# Patient Record
Sex: Male | Born: 1955 | Race: White | Hispanic: No | Marital: Married | Smoking: Never smoker
Health system: Southern US, Community
[De-identification: ages and names within clinical notes are randomized; demographics above are authoritative.]

## PROBLEM LIST (undated history)

## (undated) DIAGNOSIS — R569 Unspecified convulsions: Secondary | ICD-10-CM

## (undated) DIAGNOSIS — N529 Male erectile dysfunction, unspecified: Secondary | ICD-10-CM

## (undated) DIAGNOSIS — M1990 Unspecified osteoarthritis, unspecified site: Secondary | ICD-10-CM

## (undated) DIAGNOSIS — R42 Dizziness and giddiness: Secondary | ICD-10-CM

## (undated) DIAGNOSIS — K219 Gastro-esophageal reflux disease without esophagitis: Secondary | ICD-10-CM

## (undated) DIAGNOSIS — G5603 Carpal tunnel syndrome, bilateral upper limbs: Secondary | ICD-10-CM

## (undated) HISTORY — PX: WISDOM TOOTH EXTRACTION: SHX21

## (undated) HISTORY — PX: COLONOSCOPY: SHX174

## (undated) HISTORY — PX: POLYPECTOMY: SHX149

## (undated) HISTORY — DX: Carpal tunnel syndrome, bilateral upper limbs: G56.03

## (undated) HISTORY — DX: Gastro-esophageal reflux disease without esophagitis: K21.9

## (undated) HISTORY — DX: Unspecified osteoarthritis, unspecified site: M19.90

## (undated) HISTORY — PX: TONSILLECTOMY: SUR1361

## (undated) HISTORY — DX: Male erectile dysfunction, unspecified: N52.9

## (undated) HISTORY — DX: Dizziness and giddiness: R42

---

## 2002-03-08 ENCOUNTER — Encounter: Payer: Self-pay | Admitting: Internal Medicine

## 2002-03-08 ENCOUNTER — Ambulatory Visit (HOSPITAL_COMMUNITY): Admission: RE | Admit: 2002-03-08 | Discharge: 2002-03-08 | Payer: Self-pay | Admitting: Internal Medicine

## 2003-04-02 ENCOUNTER — Ambulatory Visit (HOSPITAL_COMMUNITY): Admission: RE | Admit: 2003-04-02 | Discharge: 2003-04-02 | Payer: Self-pay | Admitting: Internal Medicine

## 2003-04-02 ENCOUNTER — Encounter: Payer: Self-pay | Admitting: Internal Medicine

## 2004-03-14 ENCOUNTER — Ambulatory Visit (HOSPITAL_COMMUNITY): Admission: RE | Admit: 2004-03-14 | Discharge: 2004-03-14 | Payer: Self-pay | Admitting: Internal Medicine

## 2004-10-22 ENCOUNTER — Ambulatory Visit (HOSPITAL_COMMUNITY): Admission: RE | Admit: 2004-10-22 | Discharge: 2004-10-22 | Payer: Self-pay | Admitting: Orthopedic Surgery

## 2005-08-13 ENCOUNTER — Ambulatory Visit: Payer: Self-pay

## 2005-08-13 ENCOUNTER — Ambulatory Visit: Payer: Self-pay | Admitting: Internal Medicine

## 2005-08-21 ENCOUNTER — Ambulatory Visit: Payer: Self-pay

## 2011-12-17 ENCOUNTER — Other Ambulatory Visit (HOSPITAL_COMMUNITY): Payer: Self-pay | Admitting: Internal Medicine

## 2011-12-17 ENCOUNTER — Ambulatory Visit (HOSPITAL_COMMUNITY)
Admission: RE | Admit: 2011-12-17 | Discharge: 2011-12-17 | Disposition: A | Discharge status: Home / Self Care | Payer: Managed Care, Other (non HMO) | Source: Ambulatory Visit | Attending: Internal Medicine | Admitting: Internal Medicine

## 2011-12-17 DIAGNOSIS — Z0000 Encounter for general adult medical examination without abnormal findings: Secondary | ICD-10-CM

## 2011-12-17 DIAGNOSIS — Z77090 Contact with and (suspected) exposure to asbestos: Secondary | ICD-10-CM | POA: Insufficient documentation

## 2011-12-17 DIAGNOSIS — M25519 Pain in unspecified shoulder: Secondary | ICD-10-CM | POA: Insufficient documentation

## 2011-12-21 ENCOUNTER — Encounter: Payer: Self-pay | Admitting: Gastroenterology

## 2011-12-30 ENCOUNTER — Ambulatory Visit (AMBULATORY_SURGERY_CENTER): Payer: Managed Care, Other (non HMO) | Admitting: *Deleted

## 2011-12-30 ENCOUNTER — Encounter: Payer: Self-pay | Admitting: Gastroenterology

## 2011-12-30 VITALS — Ht 69.0 in | Wt 215.3 lb

## 2011-12-30 DIAGNOSIS — Z1211 Encounter for screening for malignant neoplasm of colon: Secondary | ICD-10-CM

## 2011-12-30 MED ORDER — MOVIPREP 100 G PO SOLR: ORAL | Status: DC

## 2012-01-11 ENCOUNTER — Encounter: Payer: Self-pay | Admitting: Gastroenterology

## 2012-01-11 ENCOUNTER — Ambulatory Visit (AMBULATORY_SURGERY_CENTER): Payer: Managed Care, Other (non HMO) | Admitting: Gastroenterology

## 2012-01-11 VITALS — BP 127/92 | HR 82 | Temp 97.6°F | Resp 20 | Ht 69.0 in | Wt 215.0 lb

## 2012-01-11 DIAGNOSIS — D126 Benign neoplasm of colon, unspecified: Secondary | ICD-10-CM

## 2012-01-11 DIAGNOSIS — Z1211 Encounter for screening for malignant neoplasm of colon: Secondary | ICD-10-CM

## 2012-01-11 MED ORDER — SODIUM CHLORIDE 0.9 % IV SOLN: 500.0000 mL | INTRAVENOUS | Status: DC

## 2012-01-11 NOTE — Patient Instructions (Signed)
You had a colon polyp.   Your results will be mailed to your home within 2 weeks.   Resume your routine medications today.   If you have any questions, call us at [phone]. Thank-you.

## 2012-01-11 NOTE — Op Note (Signed)
[facility] [address] [place], [place]  [address]  COLONOSCOPY PROCEDURE REPORT  PATIENT:  Barry, Miles  MR#:  [id number] BIRTHDATE:  1956-05-18, 55 yrs. old  GENDER:  male ENDOSCOPIST:  Judie Petit T. Russella Dar, MD, [facility] Referred by:  Lucky Cowboy, M.D. PROCEDURE DATE:  01/11/2012 PROCEDURE:  Colonoscopy with biopsy ASA CLASS:  Class II INDICATIONS:  1) Routine Risk Screening MEDICATIONS:   These medications were titrated to patient response per physician's verbal order 100 mcg IV, Versed 10 mg IV DESCRIPTION OF PROCEDURE:   After the risks benefits and alternatives of the procedure were thoroughly explained, informed consent was obtained.  Digital rectal exam was performed and revealed no abnormalities.   The LB 180AL [id number] endoscope was introduced through the anus and advanced to the cecum, which was identified by both the appendix and ileocecal valve, without limitations.  The quality of the prep was excellent, using MoviPrep.  The instrument was then slowly withdrawn as the colon was fully examined. <<PROCEDUREIMAGES>> FINDINGS:  A sessile polyp was found in the mid transverse colon. It was 4 mm in size. The polyp was removed using cold biopsy forceps.  Otherwise normal colonoscopy without other polyps, masses, vascular ectasias, or inflammatory changes.   Retroflexed views in the rectum revealed no abnormalities.  The time to cecum = 2  minutes. The scope was then withdrawn (time =  10.67 min) from the patient and the procedure completed.  COMPLICATIONS:  None  ENDOSCOPIC IMPRESSION: 1) 4 mm sessile polyp in the mid transverse colon  RECOMMENDATIONS: 1) Await pathology results 2) If the polyp is adenomatous (pre-cancerous), repeat colonoscopy in 5 years. Otherwise follow colorectal cancer screening guidelines for "routine risk" patients with colonoscopy in 10 years.  Venita Lick. Russella Dar, MD, [facility]  n. eSIGNED:   Venita Lick. Derec Mozingo at 01/11/2012 02:26  PM  Lauretta Grill, [id number]

## 2012-01-11 NOTE — Progress Notes (Signed)
Patient did not have preoperative order for IV antibiotic SSI prophylaxis. (G8918)  Patient did not experience any of the following events: a burn prior to discharge; a fall within the facility; wrong site/side/patient/procedure/implant event; or a hospital transfer or hospital admission upon discharge from the facility. (G8907)  

## 2012-01-12 ENCOUNTER — Telehealth: Payer: Self-pay | Admitting: *Deleted

## 2012-01-12 NOTE — Telephone Encounter (Signed)
  Follow up Call-  Call back number 01/11/2012  Post procedure Call Back phone  # [phone] hm  Permission to leave phone message Yes     Patient questions:  Do you have a fever, pain , or abdominal swelling? no Pain Score  0 *  Have you tolerated food without any problems? yes  Have you been able to return to your normal activities? yes  Do you have any questions about your discharge instructions: Diet   no Medications  no Follow up visit  no  Do you have questions or concerns about your Care? no  Actions: * If pain score is 4 or above: No action needed, pain <4.

## 2012-01-15 ENCOUNTER — Encounter: Payer: Self-pay | Admitting: Gastroenterology

## 2012-02-20 ENCOUNTER — Ambulatory Visit (INDEPENDENT_AMBULATORY_CARE_PROVIDER_SITE_OTHER): Payer: Managed Care, Other (non HMO) | Admitting: Family Medicine

## 2012-02-20 VITALS — BP 144/84 | HR 83 | Temp 98.4°F | Resp 16 | Ht 69.0 in | Wt 213.0 lb

## 2012-02-20 DIAGNOSIS — J339 Nasal polyp, unspecified: Secondary | ICD-10-CM

## 2012-02-20 DIAGNOSIS — R42 Dizziness and giddiness: Secondary | ICD-10-CM

## 2012-02-20 DIAGNOSIS — J302 Other seasonal allergic rhinitis: Secondary | ICD-10-CM

## 2012-02-20 DIAGNOSIS — M549 Dorsalgia, unspecified: Secondary | ICD-10-CM

## 2012-02-20 DIAGNOSIS — R109 Unspecified abdominal pain: Secondary | ICD-10-CM

## 2012-02-20 DIAGNOSIS — H6980 Other specified disorders of Eustachian tube, unspecified ear: Secondary | ICD-10-CM

## 2012-02-20 LAB — POCT URINALYSIS DIPSTICK
Blood, UA: NEGATIVE
Glucose, UA: NEGATIVE
Leukocytes, UA: NEGATIVE
Nitrite, UA: NEGATIVE
Urobilinogen, UA: 0.2

## 2012-02-20 LAB — POCT UA - MICROSCOPIC ONLY
Casts, Ur, LPF, POC: NEGATIVE
Mucus, UA: POSITIVE
Yeast, UA: NEGATIVE

## 2012-02-20 MED ORDER — CYCLOBENZAPRINE HCL 10 MG PO TABS: 10.0000 mg | ORAL_TABLET | Freq: Every evening | ORAL | Status: AC | PRN

## 2012-02-20 MED ORDER — ACETAMINOPHEN-CODEINE #3 300-30 MG PO TABS: 1.0000 | ORAL_TABLET | Freq: Four times a day (QID) | ORAL | Status: AC | PRN

## 2012-02-20 MED ORDER — MOMETASONE FUROATE 50 MCG/ACT NA SUSP: 2.0000 | Freq: Every day | NASAL | Status: AC

## 2012-02-20 MED ORDER — MECLIZINE HCL 25 MG PO TABS: 25.0000 mg | ORAL_TABLET | Freq: Three times a day (TID) | ORAL | Status: AC | PRN

## 2012-02-20 MED ORDER — MELOXICAM 7.5 MG PO TABS: 7.5000 mg | ORAL_TABLET | Freq: Two times a day (BID) | ORAL | Status: AC

## 2012-02-20 NOTE — Progress Notes (Signed)
  Subjective:    Patient ID: Barry Miles, male    DOB: [birth year], 56 y.o.   MRN: [id number]  Back Pain This is a recurrent problem. The current episode started in the past 7 days. The problem occurs constantly. The problem has been gradually worsening since onset. The quality of the pain is described as cramping. The pain does not radiate. The pain is at a severity of 6/10. The pain is moderate. The pain is the same all the time. The symptoms are aggravated by twisting.   Worse with cough and sneeze; sick last week with URI symptoms  Complains of (R) sided muscle spam Denies radiation to lower extremities Denies weakness to the lower extremities  SH/ Welder  Review of Systems  Musculoskeletal: Positive for back pain.       Objective:   Physical Exam  Constitutional: He appears well-developed and well-nourished.  HENT:  Right Ear: Tympanic membrane is retracted.  Left Ear: Tympanic membrane is retracted.  Nose: Mucosal edema present.  Neck: Neck supple.  Cardiovascular: Normal rate, regular rhythm and normal heart sounds.   Pulmonary/Chest: Effort normal and breath sounds normal.  Musculoskeletal:       Lumbar back: Pain: negative SLR.  Neurological: He is alert. He has normal strength. He displays normal reflexes. No sensory deficit. He exhibits normal muscle tone.  Reflex Scores:      Patellar reflexes are 1+ on the right side and 1+ on the left side. Skin: Skin is warm.  Psychiatric: He has a normal mood and affect.     Results for orders placed in visit on 02/20/12  POCT UA - MICROSCOPIC ONLY      Component Value Range   WBC, Ur, HPF, POC 1-3     RBC, urine, microscopic 1-3     Bacteria, U Microscopic 1+     Mucus, UA pos     Epithelial cells, urine per micros neg     Crystals, Ur, HPF, POC neg     Casts, Ur, LPF, POC neg     Yeast, UA neg    POCT URINALYSIS DIPSTICK      Component Value Range   Color, UA yellow     Clarity, UA clear     Glucose, UA neg      Bilirubin, UA neg     Ketones, UA neg     Spec Grav, UA 1.015     Blood, UA neg     pH, UA 6.5     Protein, UA neg     Urobilinogen, UA 0.2     Nitrite, UA neg     Leukocytes, UA Negative          Assessment & Plan:   1. Flank pain  POCT UA - Microscopic Only, POCT urinalysis dipstick  2. Back pain, acute  cyclobenzaprine (FLEXERIL) 10 MG tablet, meloxicam (MOBIC) 7.5 MG tablet, acetaminophen-codeine (TYLENOL #3) 300-30 MG per tablet  3. Vertigo  mometasone (NASONEX) 50 MCG/ACT nasal spray  4. ETD (eustachian tube dysfunction)  mometasone (NASONEX) 50 MCG/ACT nasal spray  5. Seasonal allergies     Anticipatory guidance

## 2013-03-07 ENCOUNTER — Encounter: Payer: Managed Care, Other (non HMO) | Admitting: Cardiology

## 2014-01-26 ENCOUNTER — Encounter: Payer: Self-pay | Admitting: *Deleted

## 2014-01-26 DIAGNOSIS — G5603 Carpal tunnel syndrome, bilateral upper limbs: Secondary | ICD-10-CM | POA: Insufficient documentation

## 2014-02-01 ENCOUNTER — Encounter: Payer: Self-pay | Admitting: Emergency Medicine

## 2014-03-02 ENCOUNTER — Ambulatory Visit (INDEPENDENT_AMBULATORY_CARE_PROVIDER_SITE_OTHER): Payer: Managed Care, Other (non HMO) | Admitting: Emergency Medicine

## 2014-03-02 VITALS — BP 118/84 | HR 76 | Temp 98.1°F | Resp 16 | Ht 68.0 in | Wt 215.2 lb

## 2014-03-02 DIAGNOSIS — E559 Vitamin D deficiency, unspecified: Secondary | ICD-10-CM

## 2014-03-02 DIAGNOSIS — Z1212 Encounter for screening for malignant neoplasm of rectum: Secondary | ICD-10-CM

## 2014-03-02 DIAGNOSIS — K219 Gastro-esophageal reflux disease without esophagitis: Secondary | ICD-10-CM

## 2014-03-02 DIAGNOSIS — Z79899 Other long term (current) drug therapy: Secondary | ICD-10-CM

## 2014-03-02 DIAGNOSIS — Z111 Encounter for screening for respiratory tuberculosis: Secondary | ICD-10-CM

## 2014-03-02 DIAGNOSIS — N529 Male erectile dysfunction, unspecified: Secondary | ICD-10-CM

## 2014-03-02 DIAGNOSIS — Z0000 Encounter for general adult medical examination without abnormal findings: Secondary | ICD-10-CM

## 2014-03-02 LAB — CBC WITH DIFFERENTIAL/PLATELET
BASOS ABS: 0 10*3/uL (ref 0.0–0.1)
BASOS PCT: 0 % (ref 0–1)
EOS ABS: 0.1 10*3/uL (ref 0.0–0.7)
Eosinophils Relative: 2 % (ref 0–5)
HCT: 45.8 % (ref 39.0–52.0)
HEMOGLOBIN: 16 g/dL (ref 13.0–17.0)
Lymphocytes Relative: 33 % (ref 12–46)
Lymphs Abs: 1.9 10*3/uL (ref 0.7–4.0)
MCH: 31.5 pg (ref 26.0–34.0)
MCHC: 34.9 g/dL (ref 30.0–36.0)
MCV: 90.2 fL (ref 78.0–100.0)
Monocytes Absolute: 0.5 10*3/uL (ref 0.1–1.0)
Monocytes Relative: 8 % (ref 3–12)
NEUTROS ABS: 3.3 10*3/uL (ref 1.7–7.7)
NEUTROS PCT: 57 % (ref 43–77)
PLATELETS: 167 10*3/uL (ref 150–400)
RBC: 5.08 MIL/uL (ref 4.22–5.81)
RDW: 13.7 % (ref 11.5–15.5)
WBC: 5.8 10*3/uL (ref 4.0–10.5)

## 2014-03-02 LAB — HEMOGLOBIN A1C
HEMOGLOBIN A1C: 5.4 % (ref <5.7)
MEAN PLASMA GLUCOSE: 108 mg/dL (ref <117)

## 2014-03-02 NOTE — Patient Instructions (Addendum)
Erectile Dysfunction Erectile dysfunction is the inability to get or sustain a good enough erection to have sexual intercourse. Erectile dysfunction may involve:  Inability to get an erection.  Lack of enough hardness to allow penetration.  Loss of the erection before sex is finished.  Premature ejaculation. CAUSES  Certain drugs, such as:  Pain relievers.  Antihistamines.  Antidepressants.  Blood pressure medicines.  Water pills (diuretics).  Ulcer medicines.  Muscle relaxants.  Illegal drugs.  Excessive drinking.  Psychological causes, such as:  Anxiety.  Depression.  Sadness.  Exhaustion.  Performance fear.  Stress.  Physical causes, such as:  Artery problems. This may include diabetes, smoking, liver disease, or atherosclerosis.  High blood pressure.  Hormonal problems, such as low testosterone.  Obesity.  Nerve problems. This may include back or pelvic injuries, diabetes mellitus, multiple sclerosis, or Parkinson disease. SYMPTOMS  Inability to get an erection.  Lack of enough hardness to allow penetration.  Loss of the erection before sex is finished.  Premature ejaculation.  Normal erections at some times, but with frequent unsatisfactory episodes.  Orgasms that are not satisfactory in sensation or frequency.  Low sexual satisfaction in either partner because of erection problems.  A curved penis occurring with erection. The curve may cause pain or may be too curved to allow for intercourse.  Never having nighttime erections. DIAGNOSIS Your caregiver can often diagnose this condition by:  Performing a physical exam to find other diseases or specific problems with the penis.  Asking you detailed questions about the problem.  Performing blood tests to check for diabetes mellitus or to measure hormone levels.  Performing urine tests to find other underlying health conditions.  Performing an ultrasound exam to check for  scarring.  Performing a test to check blood flow to the penis.  Doing a sleep study at home to measure nighttime erections. TREATMENT   You may be prescribed medicines by mouth.  You may be given medicine injections into the penis.  You may be prescribed a vacuum pump with a ring.  Penile implant surgery may be performed. You may receive:  An inflatable implant.  A semirigid implant.  Blood vessel surgery may be performed. HOME CARE INSTRUCTIONS  If you are prescribed oral medicine, you should take the medicine as prescribed. Do not increase the dosage without first discussing it with your physician.  If you are using self-injections, be careful to avoid any veins that are on the surface of the penis. Apply pressure to the injection site for 5 minutes.  If you are using a vacuum pump, make sure you have read the instructions before using it. Discuss any questions with your physician before taking the pump home. SEEK MEDICAL CARE IF:  You experience pain that is not responsive to the pain medicine you have been prescribed.  You experience nausea or vomiting. SEEK IMMEDIATE MEDICAL CARE IF:   When taking oral or injectable medications, you experience an erection that lasts longer than 4 hours. If your physician is unavailable, go to the nearest emergency room for evaluation. An erection that lasts much longer than 4 hours can result in permanent damage to your penis.  You have pain that is severe.  You develop redness, severe pain, or severe swelling of your penis.  You have redness spreading up into your groin or lower abdomen.  You are unable to pass your urine. Document Released: 11/20/2000 Document Revised: 07/26/2013 Document Reviewed: 04/27/2013 [facility] Patient Information 2014 [place]. Diet for Gastroesophageal Reflux  Disease, Adult Reflux is when stomach acid flows up into the esophagus. The esophagus becomes irritated and sore (inflammation). When reflux  happens often and is severe, it is called gastroesophageal reflux disease (GERD). What you eat can help ease any discomfort caused by GERD. FOODS OR DRINKS TO AVOID OR LIMIT Coffee and black tea, with or without caffeine. Bubbly (carbonated) drinks with caffeine or energy drinks. Strong spices, such as pepper, cayenne pepper, curry, or chili powder. Peppermint or spearmint. Chocolate. High-fat foods, such as meats, fried food, oils, butter, or nuts. Fruits and vegetables that cause discomfort. This includes citrus fruits and tomatoes. Alcohol. If a certain food or drink irritates your GERD, avoid eating or drinking it. THINGS THAT MAY HELP GERD INCLUDE: Eat meals slowly. Eat 5 to 6 small meals a day, not 3 large meals. Do not eat food for a certain amount of time if it causes discomfort. Wait 3 hours after eating before lying down. Keep the head of your bed raised 6 to 9 inches (15 23 centimeters). Put a foam wedge or blocks under the legs of the bed. Stay active. Weight loss, if needed, may help ease your discomfort. Wear loose-fitting clothing. Do not smoke or chew tobacco. Document Released: 05/24/2012 Document Reviewed: 05/24/2012 [facility] Patient Information 2014 [place].

## 2014-03-03 LAB — MICROALBUMIN / CREATININE URINE RATIO
CREATININE, URINE: 131.5 mg/dL
MICROALB/CREAT RATIO: 3.8 mg/g (ref 0.0–30.0)
Microalb, Ur: 0.5 mg/dL (ref 0.00–1.89)

## 2014-03-03 LAB — TSH: TSH: 1.661 u[IU]/mL (ref 0.350–4.500)

## 2014-03-03 LAB — INSULIN, FASTING: INSULIN FASTING, SERUM: 7 u[IU]/mL (ref 3–28)

## 2014-03-03 LAB — VITAMIN D 25 HYDROXY (VIT D DEFICIENCY, FRACTURES): VIT D 25 HYDROXY: 86 ng/mL (ref 30–89)

## 2014-03-03 LAB — URINALYSIS, ROUTINE W REFLEX MICROSCOPIC
Bilirubin Urine: NEGATIVE
GLUCOSE, UA: NEGATIVE mg/dL
HGB URINE DIPSTICK: NEGATIVE
KETONES UR: NEGATIVE mg/dL
Leukocytes, UA: NEGATIVE
NITRITE: NEGATIVE
PH: 6.5 (ref 5.0–8.0)
Protein, ur: NEGATIVE mg/dL
SPECIFIC GRAVITY, URINE: 1.017 (ref 1.005–1.030)
Urobilinogen, UA: 0.2 mg/dL (ref 0.0–1.0)

## 2014-03-03 LAB — LIPID PANEL
CHOL/HDL RATIO: 3.4 ratio
Cholesterol: 155 mg/dL (ref 0–200)
HDL: 46 mg/dL (ref >39)
LDL CALC: 81 mg/dL (ref 0–99)
Triglycerides: 140 mg/dL (ref <150)
VLDL: 28 mg/dL (ref 0–40)

## 2014-03-03 LAB — PSA: PSA: 0.93 ng/mL (ref <=4.00)

## 2014-03-03 LAB — BASIC METABOLIC PANEL WITH GFR
BUN: 14 mg/dL (ref 6–23)
CALCIUM: 9.1 mg/dL (ref 8.4–10.5)
CO2: 23 mEq/L (ref 19–32)
Chloride: 104 mEq/L (ref 96–112)
Creat: 1.07 mg/dL (ref 0.50–1.35)
GFR, EST NON AFRICAN AMERICAN: 78 mL/min
GLUCOSE: 77 mg/dL (ref 70–99)
POTASSIUM: 3.8 meq/L (ref 3.5–5.3)
SODIUM: 140 meq/L (ref 135–145)

## 2014-03-03 LAB — HEPATIC FUNCTION PANEL
ALBUMIN: 4.3 g/dL (ref 3.5–5.2)
ALK PHOS: 64 U/L (ref 39–117)
ALT: 22 U/L (ref 0–53)
AST: 21 U/L (ref 0–37)
BILIRUBIN INDIRECT: 0.6 mg/dL (ref 0.2–1.2)
BILIRUBIN TOTAL: 0.7 mg/dL (ref 0.2–1.2)
Bilirubin, Direct: 0.1 mg/dL (ref 0.0–0.3)
Total Protein: 6.8 g/dL (ref 6.0–8.3)

## 2014-03-03 LAB — TESTOSTERONE: Testosterone: 375 ng/dL (ref 300–890)

## 2014-03-03 LAB — MAGNESIUM: Magnesium: 1.9 mg/dL (ref 1.5–2.5)

## 2014-03-05 LAB — TB SKIN TEST
INDURATION: 0 mm
TB Skin Test: NEGATIVE

## 2014-03-06 ENCOUNTER — Encounter: Payer: Self-pay | Admitting: Emergency Medicine

## 2014-03-06 DIAGNOSIS — K219 Gastro-esophageal reflux disease without esophagitis: Secondary | ICD-10-CM | POA: Insufficient documentation

## 2014-03-06 NOTE — Progress Notes (Signed)
Subjective:    Patient ID: Barry Miles, male    DOB: August 24, 1956, 58 y.o.   MRN: [id number]  HPI Comments: 58 yo pleasant WM CPE with concerns about reflux and ED. He notes mild increase in reflux with certain foods. He notes improves when limits those foods. He denies any bad breath or pain. He has not tried any OTC remedies routinely.  He is also concerned with difficulty getting and maintaining erection. He notes he has not spoken to his wife about this and it is causing marital issues.   She is exercising routinely with active job and overtime and eating healthy.      Current Outpatient Prescriptions on File Prior to Visit  Medication Sig Dispense Refill  . Cholecalciferol (VITAMIN D3) 2000 UNITS TABS Take 3 capsules by mouth 3 (three) times daily.      Marland Kitchen ibuprofen (ADVIL,MOTRIN) 200 MG tablet Take 200 mg by mouth every 6 (six) hours as needed.      . Multiple Vitamins-Minerals (MULTIVITAMIN WITH MINERALS) tablet Take 1 tablet by mouth daily.      . vitamin C (ASCORBIC ACID) 500 MG tablet Take 500 mg by mouth daily.       No current facility-administered medications on file prior to visit.   No Known Allergies Past Medical History  Diagnosis Date  . Arthritis   . Arthritis   . GERD (gastroesophageal reflux disease)   . Carpal tunnel syndrome on both sides    Past Surgical History  Procedure Laterality Date  . Tonsillectomy     History  Substance Use Topics  . Smoking status: Never Smoker   . Smokeless tobacco: Never Used  . Alcohol Use: No   Family History  Problem Relation Age of Onset  . Diabetes Mother   . Breast cancer Mother   . Diabetes Father   . Heart disease Father   . Colon polyps Maternal Uncle   . Diabetes Maternal Grandmother   . Diabetes Paternal Grandmother   . Colon polyps Maternal Aunt   . Colon cancer Neg Hx   . Esophageal cancer Neg Hx   . Stomach cancer Neg Hx     Patient Care Team: Unk Pinto, MD as PCP - General (Internal  Medicine) Eulis Manly. Gershon Crane, MD as Consulting Physician (Ophthalmology) Fay Records, MD as Consulting Physician (Cardiology) Ladene Artist, MD as Consulting Physician (Gastroenterology) Franchot Gallo, MD as Consulting Physician (Urology) Roseanne Kaufman, MD as Consulting Physician (Orthopedic Surgery)  MAINTENANCE: Colonoscopy: 01/11/12 repeat 3-5 years EYE: 2014 wnl Dentist: q 6 month  IMMUNIZATIONS: Tdap:2006 Pneumovax:n/a Zostavax:n/a Influenza:2014 at work   Review of Systems  Gastrointestinal:       Reflux  Genitourinary:       ED  All other systems reviewed and are negative.   BP 118/84  Pulse 76  Temp(Src) 98.1 F (36.7 C)  Resp 16  Ht 5\' 8"  (1.727 m)  Wt 215 lb 3.2 oz (97.614 kg)  BMI 32.73 kg/m2     Objective:   Physical Exam  Nursing note and vitals reviewed. Constitutional: He is oriented to person, place, and time. He appears well-developed and well-nourished.  HENT:  Head: Normocephalic and atraumatic.  Right Ear: External ear normal.  Left Ear: External ear normal.  Nose: Nose normal.  Mouth/Throat: Oropharynx is clear and moist. No oropharyngeal exudate.  Eyes: Conjunctivae and EOM are normal. Pupils are equal, round, and reactive to light. Right eye exhibits no discharge. Left eye exhibits no discharge.  No scleral icterus.  Neck: Normal range of motion. Neck supple. No JVD present. No tracheal deviation present. No thyromegaly present.  Cardiovascular: Normal rate, regular rhythm, normal heart sounds and intact distal pulses.   Pulmonary/Chest: Effort normal and breath sounds normal.  Abdominal: Soft. Bowel sounds are normal. He exhibits no distension and no mass. There is no tenderness. There is no rebound and no guarding.  Genitourinary: Rectum normal. Guaiac negative stool. No penile tenderness.  1+ prostate soft, Hemorrhoid NT, non-thrombosed ext  Musculoskeletal: Normal range of motion. He exhibits no edema and no tenderness.   Lymphadenopathy:    He has no cervical adenopathy.  Neurological: He is alert and oriented to person, place, and time. He has normal reflexes. No cranial nerve deficit. He exhibits normal muscle tone. Coordination normal.  Skin: Skin is warm and dry. No rash noted. No erythema. No pallor.  Psychiatric: He has a normal mood and affect. His behavior is normal. Judgment and thought content normal.          Assessment & Plan:  1. CPE- Update screening labs/ History/ Immunizations/ Testing as needed. Advised healthy diet, QD exercise, increase H20 and continue RX/ Vitamins AD.  2. GERD- Diet/ hygiene discussed, May try OTC Nexium and call with results  3. Erectile dysfunction- Discussed possibility of mental block and need to discus with wife, SX given Stendra 100 mg, Cialis 20 mg both AD, do not take together advised try to take 1/2 of either and call with results for prescription. SE explained ER if occur.

## 2014-04-02 ENCOUNTER — Other Ambulatory Visit: Payer: Self-pay | Admitting: Emergency Medicine

## 2014-04-02 MED ORDER — SILDENAFIL CITRATE 100 MG PO TABS: 100.0000 mg | ORAL_TABLET | ORAL | Status: DC | PRN

## 2015-02-04 ENCOUNTER — Encounter: Payer: Self-pay | Admitting: Emergency Medicine

## 2015-03-07 ENCOUNTER — Encounter: Payer: Self-pay | Admitting: Emergency Medicine

## 2015-03-07 ENCOUNTER — Ambulatory Visit (INDEPENDENT_AMBULATORY_CARE_PROVIDER_SITE_OTHER): Payer: Managed Care, Other (non HMO) | Admitting: Emergency Medicine

## 2015-03-07 VITALS — BP 142/86 | HR 64 | Temp 98.2°F | Resp 18 | Ht 68.0 in | Wt 218.0 lb

## 2015-03-07 DIAGNOSIS — Z1212 Encounter for screening for malignant neoplasm of rectum: Secondary | ICD-10-CM

## 2015-03-07 DIAGNOSIS — Z125 Encounter for screening for malignant neoplasm of prostate: Secondary | ICD-10-CM

## 2015-03-07 DIAGNOSIS — Z0000 Encounter for general adult medical examination without abnormal findings: Secondary | ICD-10-CM

## 2015-03-07 DIAGNOSIS — Z833 Family history of diabetes mellitus: Secondary | ICD-10-CM

## 2015-03-07 DIAGNOSIS — Z0001 Encounter for general adult medical examination with abnormal findings: Secondary | ICD-10-CM

## 2015-03-07 DIAGNOSIS — R6889 Other general symptoms and signs: Secondary | ICD-10-CM

## 2015-03-07 DIAGNOSIS — R5381 Other malaise: Secondary | ICD-10-CM

## 2015-03-07 DIAGNOSIS — Z789 Other specified health status: Secondary | ICD-10-CM

## 2015-03-07 DIAGNOSIS — R030 Elevated blood-pressure reading, without diagnosis of hypertension: Secondary | ICD-10-CM

## 2015-03-07 DIAGNOSIS — Z79899 Other long term (current) drug therapy: Secondary | ICD-10-CM

## 2015-03-07 DIAGNOSIS — E559 Vitamin D deficiency, unspecified: Secondary | ICD-10-CM

## 2015-03-07 DIAGNOSIS — R5383 Other fatigue: Secondary | ICD-10-CM

## 2015-03-07 LAB — CBC WITH DIFFERENTIAL/PLATELET
BASOS PCT: 0 % (ref 0–1)
Basophils Absolute: 0 10*3/uL (ref 0.0–0.1)
EOS ABS: 0.1 10*3/uL (ref 0.0–0.7)
Eosinophils Relative: 2 % (ref 0–5)
HEMATOCRIT: 45.5 % (ref 39.0–52.0)
HEMOGLOBIN: 15.7 g/dL (ref 13.0–17.0)
LYMPHS ABS: 1.5 10*3/uL (ref 0.7–4.0)
LYMPHS PCT: 28 % (ref 12–46)
MCH: 31.3 pg (ref 26.0–34.0)
MCHC: 34.5 g/dL (ref 30.0–36.0)
MCV: 90.6 fL (ref 78.0–100.0)
MPV: 9.4 fL (ref 8.6–12.4)
Monocytes Absolute: 0.4 10*3/uL (ref 0.1–1.0)
Monocytes Relative: 7 % (ref 3–12)
NEUTROS ABS: 3.3 10*3/uL (ref 1.7–7.7)
NEUTROS PCT: 63 % (ref 43–77)
Platelets: 160 10*3/uL (ref 150–400)
RBC: 5.02 MIL/uL (ref 4.22–5.81)
RDW: 13.7 % (ref 11.5–15.5)
WBC: 5.3 10*3/uL (ref 4.0–10.5)

## 2015-03-07 LAB — HEMOGLOBIN A1C
HEMOGLOBIN A1C: 5.6 % (ref <5.7)
MEAN PLASMA GLUCOSE: 114 mg/dL (ref <117)

## 2015-03-07 MED ORDER — TADALAFIL 20 MG PO TABS: 20.0000 mg | ORAL_TABLET | Freq: Every day | ORAL | Status: DC | PRN

## 2015-03-07 NOTE — Patient Instructions (Signed)
Erectile Dysfunction Erectile dysfunction is the inability to get or sustain a good enough erection to have sexual intercourse. Erectile dysfunction may involve:  Inability to get an erection.  Lack of enough hardness to allow penetration.  Loss of the erection before sex is finished.  Premature ejaculation. CAUSES  Certain drugs, such as:  Pain relievers.  Antihistamines.  Antidepressants.  Blood pressure medicines.  Water pills (diuretics).  Ulcer medicines.  Muscle relaxants.  Illegal drugs.  Excessive drinking.  Psychological causes, such as:  Anxiety.  Depression.  Sadness.  Exhaustion.  Performance fear.  Stress.  Physical causes, such as:  Artery problems. This may include diabetes, smoking, liver disease, or atherosclerosis.  High blood pressure.  Hormonal problems, such as low testosterone.  Obesity.  Nerve problems. This may include back or pelvic injuries, diabetes mellitus, multiple sclerosis, or Parkinson disease. SYMPTOMS  Inability to get an erection.  Lack of enough hardness to allow penetration.  Loss of the erection before sex is finished.  Premature ejaculation.  Normal erections at some times, but with frequent unsatisfactory episodes.  Orgasms that are not satisfactory in sensation or frequency.  Low sexual satisfaction in either partner because of erection problems.  A curved penis occurring with erection. The curve may cause pain or may be too curved to allow for intercourse.  Never having nighttime erections. DIAGNOSIS Your caregiver can often diagnose this condition by:  Performing a physical exam to find other diseases or specific problems with the penis.  Asking you detailed questions about the problem.  Performing blood tests to check for diabetes mellitus or to measure hormone levels.  Performing urine tests to find other underlying health conditions.  Performing an ultrasound exam to check for  scarring.  Performing a test to check blood flow to the penis.  Doing a sleep study at home to measure nighttime erections. TREATMENT   You may be prescribed medicines by mouth.  You may be given medicine injections into the penis.  You may be prescribed a vacuum pump with a ring.  Penile implant surgery may be performed. You may receive:  An inflatable implant.  A semirigid implant.  Blood vessel surgery may be performed. HOME CARE INSTRUCTIONS  If you are prescribed oral medicine, you should take the medicine as prescribed. Do not increase the dosage without first discussing it with your physician.  If you are using self-injections, be careful to avoid any veins that are on the surface of the penis. Apply pressure to the injection site for 5 minutes.  If you are using a vacuum pump, make sure you have read the instructions before using it. Discuss any questions with your physician before taking the pump home. SEEK MEDICAL CARE IF:  You experience pain that is not responsive to the pain medicine you have been prescribed.  You experience nausea or vomiting. SEEK IMMEDIATE MEDICAL CARE IF:   When taking oral or injectable medications, you experience an erection that lasts longer than 4 hours. If your physician is unavailable, go to the nearest emergency room for evaluation. An erection that lasts much longer than 4 hours can result in permanent damage to your penis.  You have pain that is severe.  You develop redness, severe pain, or severe swelling of your penis.  You have redness spreading up into your groin or lower abdomen.  You are unable to pass your urine. Document Released: 11/20/2000 Document Revised: 07/26/2013 Document Reviewed: 04/27/2013 [facility] Patient Information 2015 [place], [place]. This information is not  intended to replace advice given to you by your health care provider. Make sure you discuss any questions you have with your health care provider.

## 2015-03-07 NOTE — Progress Notes (Signed)
Subjective:    Patient ID: Barry Miles, male    DOB: Nov 04, 1956, 59 y.o.   MRN: [id number]  HPI Comments: 59 YO WM CPE with + FHX Cardiac disease/ diabetes but negative personal history. He denies CV symptoms. He is not eating as healthy. He is exercising sporadically. He keeps active. He notes occasionally fatigued with long work hours but resolves. He is unsure but thinks he may snore when sleeping.  He denies any enlarged prostate symptoms/ urinary issues but notes difficulty getting/ maintaining erection. He notes viagra made Ears feel hot but worked. Cialis worked best without side effects. He would like prescription for medication.  Lab Results      Component                Value               Date                      WBC                      5.8                 03/02/2014                HGB                      16.0                03/02/2014                HCT                      45.8                03/02/2014                PLT                      167                 03/02/2014                GLUCOSE                  77                  03/02/2014                CHOL                     155                 03/02/2014                TRIG                     140                 03/02/2014                HDL                      46                  03/02/2014  LDLCALC                  81                  03/02/2014                ALT                      22                  03/02/2014                AST                      21                  03/02/2014                NA                       140                 03/02/2014                K                        3.8                 03/02/2014                CL                       104                 03/02/2014                CREATININE               1.07                03/02/2014                BUN                      14                  03/02/2014                CO2                      23                   03/02/2014                TSH                      1.661               03/02/2014                PSA                      0.93                03/02/2014  HGBA1C                   5.4                 03/02/2014                MICROALBUR               0.50                03/02/2014                Medication List       This list is accurate as of: 03/07/15 11:02 AM.  Always use your most recent med list.               ibuprofen 200 MG tablet  Commonly known as:  ADVIL,MOTRIN  Take 200 mg by mouth every 6 (six) hours as needed.     Magnesium 400 MG Caps  Take 400 mg by mouth daily.     multivitamin with minerals tablet  Take 1 tablet by mouth daily.     sildenafil 100 MG tablet  Commonly known as:  VIAGRA  Take 1 tablet (100 mg total) by mouth as needed for erectile dysfunction.     tadalafil 20 MG tablet  Commonly known as:  CIALIS  Take 20 mg by mouth daily as needed for erectile dysfunction.     vitamin C 500 MG tablet  Commonly known as:  ASCORBIC ACID  Take 500 mg by mouth daily.     Vitamin D3 2000 UNITS Tabs  Take 3 capsules by mouth 3 (three) times daily.       No Known Allergies   Past Medical History  Diagnosis Date  . Arthritis   . Arthritis   . GERD (gastroesophageal reflux disease)   . Carpal tunnel syndrome on both sides    Past Surgical History  Procedure Laterality Date  . Tonsillectomy      History  Substance Use Topics  . Smoking status: Never Smoker   . Smokeless tobacco: Never Used  . Alcohol Use: No   Family History  Problem Relation Age of Onset  . Diabetes Mother   . Breast cancer Mother   . Diabetes Father   . Heart disease Father   . Colon polyps Maternal Uncle   . Diabetes Maternal Grandmother   . Diabetes Paternal Grandmother   . Colon polyps Maternal Aunt   . Colon cancer Neg Hx   . Esophageal cancer Neg Hx   . Stomach cancer Neg Hx    MAINTENANCE: Colonoscopy:01/11/12 POLYP due 2018 EYE: Glasses 2015  WNL Dentist:Q 6 month Stress- 2006 EF 55%  IMMUNIZATIONS: Tdap:2014 Pneumovax:n/a Zostavax:n/a Influenza:2015 @ work  Patient Care Team: Unk Pinto, MD as PCP - General (Internal Medicine) Rutherford Guys, MD as Consulting Physician (Ophthalmology) Fay Records, MD as Consulting Physician (Cardiology) Ladene Artist, MD as Consulting Physician (Gastroenterology) Franchot Gallo, MD as Consulting Physician (Urology) Roseanne Kaufman, MD as Consulting Physician (Orthopedic Surgery) Philipp Ovens, (Dentist)  Review of Systems  Constitutional: Negative for fatigue.  Respiratory: Negative for shortness of breath.   Cardiovascular: Negative for chest pain.  Genitourinary: Negative for decreased urine volume and penile pain.  Psychiatric/Behavioral: The patient is not nervous/anxious.   All other systems reviewed and are negative.  BP 142/86 mmHg  Pulse 64  Temp(Src) 98.2 F (36.8 C) (Temporal)  Resp 18  Ht 5\' 8"  (1.727 m)  Wt  218 lb (98.884 kg)  BMI 33.15 kg/m2     Objective:   Physical Exam  Constitutional: He is oriented to person, place, and time. He appears well-developed and well-nourished.  HENT:  Head: Normocephalic.  Nose: Nose normal.  Mouth/Throat: Oropharynx is clear and moist.  Large neck and tongue with partially obstructed airway.  Eyes: Conjunctivae and EOM are normal. Pupils are equal, round, and reactive to light. No scleral icterus.  Neck: Normal range of motion. Neck supple. No JVD present. No tracheal deviation present. No thyromegaly present.  Cardiovascular: Normal rate, regular rhythm, normal heart sounds and intact distal pulses.   Pulmonary/Chest: Effort normal and breath sounds normal.  Abdominal: Soft. Bowel sounds are normal. He exhibits no distension and no mass. There is no tenderness. There is no rebound and no guarding.  Genitourinary: Guaiac negative stool.  Mildly enlarged prostate  Musculoskeletal: Normal range of motion. He exhibits no  edema or tenderness.  Lymphadenopathy:    He has no cervical adenopathy.  Neurological: He is alert and oriented to person, place, and time. He has normal reflexes. No cranial nerve deficit. He exhibits normal muscle tone. Coordination normal.  Skin: Skin is warm and dry. No rash noted. No erythema.  Psychiatric: He has a normal mood and affect. His behavior is normal. Judgment and thought content normal.  Nursing note and vitals reviewed.   AORTA SCAN WNL EKG NSCSPT Prolonged QT STABLE     Assessment & Plan:  1. CPE- Update screening labs/ History/ Immunizations/ Testing as needed. Advised healthy diet, QD exercise, increase H20 and continue RX/ Vitamins AD. Screening for + FHX  2. Erectile dysfunction- Advised can refer to Urology for further workup but declines.   3. Fatigue- check labs, increase activity and H2O. If symptoms continue may need sleep study evaluation, patient will call if no improvement  4. Elevated BP w/o DX of HTN- Check BP call if >130/80, increase cardio, decrease stress

## 2015-03-08 LAB — URINALYSIS, ROUTINE W REFLEX MICROSCOPIC
Bilirubin Urine: NEGATIVE
GLUCOSE, UA: NEGATIVE mg/dL
Hgb urine dipstick: NEGATIVE
KETONES UR: NEGATIVE mg/dL
Leukocytes, UA: NEGATIVE
Nitrite: NEGATIVE
PH: 5 (ref 5.0–8.0)
Protein, ur: NEGATIVE mg/dL
Urobilinogen, UA: 0.2 mg/dL (ref 0.0–1.0)

## 2015-03-08 LAB — HEPATIC FUNCTION PANEL
ALK PHOS: 64 U/L (ref 39–117)
ALT: 17 U/L (ref 0–53)
AST: 21 U/L (ref 0–37)
Albumin: 4.3 g/dL (ref 3.5–5.2)
BILIRUBIN INDIRECT: 0.6 mg/dL (ref 0.2–1.2)
Bilirubin, Direct: 0.1 mg/dL (ref 0.0–0.3)
TOTAL PROTEIN: 6.8 g/dL (ref 6.0–8.3)
Total Bilirubin: 0.7 mg/dL (ref 0.2–1.2)

## 2015-03-08 LAB — BASIC METABOLIC PANEL WITH GFR
BUN: 13 mg/dL (ref 6–23)
CHLORIDE: 108 meq/L (ref 96–112)
CO2: 23 meq/L (ref 19–32)
CREATININE: 1.11 mg/dL (ref 0.50–1.35)
Calcium: 8.9 mg/dL (ref 8.4–10.5)
GFR, Est African American: 85 mL/min
GFR, Est Non African American: 74 mL/min
Glucose, Bld: 88 mg/dL (ref 70–99)
Potassium: 3.9 mEq/L (ref 3.5–5.3)
Sodium: 141 mEq/L (ref 135–145)

## 2015-03-08 LAB — LIPID PANEL
Cholesterol: 151 mg/dL (ref 0–200)
HDL: 42 mg/dL (ref >=40)
LDL CALC: 79 mg/dL (ref 0–99)
TRIGLYCERIDES: 150 mg/dL — AB (ref <150)
Total CHOL/HDL Ratio: 3.6 Ratio
VLDL: 30 mg/dL (ref 0–40)

## 2015-03-08 LAB — MAGNESIUM: Magnesium: 1.9 mg/dL (ref 1.5–2.5)

## 2015-03-08 LAB — MICROALBUMIN / CREATININE URINE RATIO
Creatinine, Urine: 40.3 mg/dL
MICROALB/CREAT RATIO: 7.4 mg/g (ref 0.0–30.0)
Microalb, Ur: 0.3 mg/dL (ref <2.0)

## 2015-03-08 LAB — PSA: PSA: 0.79 ng/mL (ref <=4.00)

## 2015-03-08 LAB — INSULIN, FASTING: Insulin fasting, serum: 4 u[IU]/mL (ref 2.0–19.6)

## 2015-03-08 LAB — TSH: TSH: 1.425 u[IU]/mL (ref 0.350–4.500)

## 2015-03-08 LAB — VITAMIN D 25 HYDROXY (VIT D DEFICIENCY, FRACTURES): VIT D 25 HYDROXY: 67 ng/mL (ref 30–100)

## 2015-03-11 ENCOUNTER — Encounter: Payer: Self-pay | Admitting: Emergency Medicine

## 2015-03-11 DIAGNOSIS — N529 Male erectile dysfunction, unspecified: Secondary | ICD-10-CM | POA: Insufficient documentation

## 2016-03-09 ENCOUNTER — Ambulatory Visit (INDEPENDENT_AMBULATORY_CARE_PROVIDER_SITE_OTHER): Payer: Managed Care, Other (non HMO) | Admitting: Internal Medicine

## 2016-03-09 ENCOUNTER — Other Ambulatory Visit: Payer: Self-pay | Admitting: Internal Medicine

## 2016-03-09 ENCOUNTER — Encounter: Payer: Self-pay | Admitting: Internal Medicine

## 2016-03-09 VITALS — BP 124/82 | HR 70 | Temp 98.2°F | Resp 18 | Ht 68.0 in | Wt 215.0 lb

## 2016-03-09 DIAGNOSIS — Z0001 Encounter for general adult medical examination with abnormal findings: Secondary | ICD-10-CM

## 2016-03-09 DIAGNOSIS — Z79899 Other long term (current) drug therapy: Secondary | ICD-10-CM

## 2016-03-09 DIAGNOSIS — Z1212 Encounter for screening for malignant neoplasm of rectum: Secondary | ICD-10-CM

## 2016-03-09 DIAGNOSIS — E559 Vitamin D deficiency, unspecified: Secondary | ICD-10-CM | POA: Diagnosis not present

## 2016-03-09 DIAGNOSIS — Z0000 Encounter for general adult medical examination without abnormal findings: Secondary | ICD-10-CM

## 2016-03-09 DIAGNOSIS — Z1329 Encounter for screening for other suspected endocrine disorder: Secondary | ICD-10-CM

## 2016-03-09 DIAGNOSIS — Z13220 Encounter for screening for lipoid disorders: Secondary | ICD-10-CM

## 2016-03-09 DIAGNOSIS — Z1159 Encounter for screening for other viral diseases: Secondary | ICD-10-CM | POA: Diagnosis not present

## 2016-03-09 DIAGNOSIS — E349 Endocrine disorder, unspecified: Secondary | ICD-10-CM

## 2016-03-09 DIAGNOSIS — Z1389 Encounter for screening for other disorder: Secondary | ICD-10-CM

## 2016-03-09 DIAGNOSIS — Z125 Encounter for screening for malignant neoplasm of prostate: Secondary | ICD-10-CM

## 2016-03-09 DIAGNOSIS — Z131 Encounter for screening for diabetes mellitus: Secondary | ICD-10-CM

## 2016-03-09 DIAGNOSIS — Z130 Encounter for screening for diseases of the blood and blood-forming organs and certain disorders involving the immune mechanism: Secondary | ICD-10-CM

## 2016-03-09 DIAGNOSIS — Z136 Encounter for screening for cardiovascular disorders: Secondary | ICD-10-CM

## 2016-03-09 LAB — HEPATIC FUNCTION PANEL
ALK PHOS: 71 U/L (ref 40–115)
ALT: 25 U/L (ref 9–46)
AST: 22 U/L (ref 10–35)
Albumin: 4.3 g/dL (ref 3.6–5.1)
BILIRUBIN DIRECT: 0.1 mg/dL (ref <=0.2)
Indirect Bilirubin: 0.4 mg/dL (ref 0.2–1.2)
TOTAL PROTEIN: 6.5 g/dL (ref 6.1–8.1)
Total Bilirubin: 0.5 mg/dL (ref 0.2–1.2)

## 2016-03-09 LAB — LIPID PANEL
CHOL/HDL RATIO: 3.6 ratio (ref <=5.0)
Cholesterol: 146 mg/dL (ref 125–200)
HDL: 41 mg/dL (ref >=40)
LDL CALC: 77 mg/dL (ref <130)
TRIGLYCERIDES: 139 mg/dL (ref <150)
VLDL: 28 mg/dL (ref <30)

## 2016-03-09 LAB — IRON AND TIBC
%SAT: 19 % (ref 15–60)
IRON: 64 ug/dL (ref 50–180)
TIBC: 335 ug/dL (ref 250–425)
UIBC: 271 ug/dL (ref 125–400)

## 2016-03-09 LAB — CBC WITH DIFFERENTIAL/PLATELET
Basophils Absolute: 0 cells/uL (ref 0–200)
Basophils Relative: 0 %
EOS PCT: 3 %
Eosinophils Absolute: 165 cells/uL (ref 15–500)
HCT: 46.3 % (ref 38.5–50.0)
Hemoglobin: 15.9 g/dL (ref 13.2–17.1)
LYMPHS ABS: 1540 {cells}/uL (ref 850–3900)
Lymphocytes Relative: 28 %
MCH: 31.3 pg (ref 27.0–33.0)
MCHC: 34.3 g/dL (ref 32.0–36.0)
MCV: 91.1 fL (ref 80.0–100.0)
MONO ABS: 330 {cells}/uL (ref 200–950)
MPV: 9.3 fL (ref 7.5–12.5)
Monocytes Relative: 6 %
NEUTROS ABS: 3465 {cells}/uL (ref 1500–7800)
Neutrophils Relative %: 63 %
Platelets: 149 10*3/uL (ref 140–400)
RBC: 5.08 MIL/uL (ref 4.20–5.80)
RDW: 14 % (ref 11.0–15.0)
WBC: 5.5 10*3/uL (ref 3.8–10.8)

## 2016-03-09 LAB — HEMOGLOBIN A1C
HEMOGLOBIN A1C: 5.6 % (ref <5.7)
Mean Plasma Glucose: 114 mg/dL

## 2016-03-09 LAB — BASIC METABOLIC PANEL WITH GFR
BUN: 12 mg/dL (ref 7–25)
CALCIUM: 8.9 mg/dL (ref 8.6–10.3)
CO2: 24 mmol/L (ref 20–31)
Chloride: 107 mmol/L (ref 98–110)
Creat: 0.97 mg/dL (ref 0.70–1.33)
GFR, EST NON AFRICAN AMERICAN: 86 mL/min (ref >=60)
Glucose, Bld: 85 mg/dL (ref 65–99)
Potassium: 4 mmol/L (ref 3.5–5.3)
SODIUM: 141 mmol/L (ref 135–146)

## 2016-03-09 LAB — MAGNESIUM: Magnesium: 2.1 mg/dL (ref 1.5–2.5)

## 2016-03-09 LAB — TSH: TSH: 1.64 mIU/L (ref 0.40–4.50)

## 2016-03-09 LAB — VITAMIN B12: VITAMIN B 12: 735 pg/mL (ref 200–1100)

## 2016-03-09 NOTE — Progress Notes (Signed)
Complete Physical  Assessment and Plan:   1. Encounter for general adult medical examination with abnormal findings  - CBC with Differential/Platelet - BASIC METABOLIC PANEL WITH GFR - Hepatic function panel - Magnesium  2. Screening for diabetes mellitus  - Hemoglobin A1c - Insulin, random  3. Screening for hyperlipidemia  - Lipid panel  4. Screening for thyroid disorder  - TSH  5. Screening for hematuria or proteinuria  - Urinalysis, Routine w reflex microscopic (not at [facility]) - Microalbumin / creatinine urine ratio  6. Screening for deficiency anemia  - Iron and TIBC - Vitamin B12  7. Screening for cardiovascular condition  - EKG 12-Lead - [place], RETROPERITNL ABD,  LTD  8. Screening for rectal cancer  - POC Hemoccult Bld/Stl (3-Cd Home Screen); Future  9. Screening for prostate cancer  - PSA  10. Testosterone deficiency   11. Vitamin D deficiency  - VITAMIN D 25 Hydroxy (Vit-D Deficiency, Fractures)    Discussed med's effects and SE's. Screening labs and tests as requested with regular follow-up as recommended.  HPI Patient presents for a complete physical.   His blood pressure has been controlled at home, today their BP is BP: 124/82 mmHg He does not workout. He denies chest pain, shortness of breath, dizziness.   He is on cholesterol medication and denies myalgias. His cholesterol is at goal. The cholesterol last visit was:   Lab Results  Component Value Date   CHOL 151 03/07/2015   HDL 42 03/07/2015   LDLCALC 79 03/07/2015   TRIG 150* 03/07/2015   CHOLHDL 3.6 03/07/2015    He has been working on diet and exercise for prediabetes, he is not on bASA, he is not on ACE/ARB and denies foot ulcerations, hyperglycemia, hypoglycemia , increased appetite, nausea, paresthesia of the feet, polydipsia, polyuria, visual disturbances, vomiting and weight loss. Last A1C in the office was:  Lab Results  Component Value Date   HGBA1C 5.6 03/07/2015     Patient is on Vitamin D supplement.   Lab Results  Component Value Date   VD25OH 67 03/07/2015      Last PSA was: Lab Results  Component Value Date   PSA 0.79 03/07/2015  .  Denies BPH symptoms daytime frequency, double voiding, dysuria, hematuria, hesitancy, incontinence, intermittency, nocturia, sensation of incomplete bladder emptying, suprapubic pain, urgency or weak urinary stream.  He reports that he is not currently exercising daily.  He does do a physical job.  He would like to lose some weight.  HE admits that he has been complacent with his diet.  Current Medications:  Current Outpatient Prescriptions on File Prior to Visit  Medication Sig Dispense Refill  . Cholecalciferol (VITAMIN D3) 2000 UNITS TABS Take 3 capsules by mouth 3 (three) times daily.    Marland Kitchen ibuprofen (ADVIL,MOTRIN) 200 MG tablet Take 200 mg by mouth every 6 (six) hours as needed.    . Magnesium 400 MG CAPS Take 400 mg by mouth daily.    . Multiple Vitamins-Minerals (MULTIVITAMIN WITH MINERALS) tablet Take 1 tablet by mouth daily.    . tadalafil (CIALIS) 20 MG tablet Take 1 tablet (20 mg total) by mouth daily as needed for erectile dysfunction. 10 tablet 1  . vitamin C (ASCORBIC ACID) 500 MG tablet Take 500 mg by mouth daily.     No current facility-administered medications on file prior to visit.    Health Maintenance:  Immunization History  Administered Date(s) Administered  . Influenza-Unspecified 09/06/2013  . PPD Test 03/02/2014  .  Td 08/27/2005  . Tdap 02/01/2013    Tetanus: 2014 Flu vaccine: 09/07/15, at work Colonoscopy: 2013 Eye Exam:  Dr. Peter Garter , Glasses, goes yearly Dentist: Dr. Philipp Ovens, twice yearly  Patient Care Team: Unk Pinto, MD as PCP - General (Internal Medicine) Rutherford Guys, MD as Consulting Physician (Ophthalmology) Fay Records, MD as Consulting Physician (Cardiology) Ladene Artist, MD as Consulting Physician (Gastroenterology) Franchot Gallo, MD as  Consulting Physician (Urology) Roseanne Kaufman, MD as Consulting Physician (Orthopedic Surgery)  Allergies: No Known Allergies  Medical History:  Past Medical History  Diagnosis Date  . Arthritis   . Arthritis   . GERD (gastroesophageal reflux disease)   . Carpal tunnel syndrome on both sides   . Erectile dysfunction     Surgical History:  Past Surgical History  Procedure Laterality Date  . Tonsillectomy      Family History:  Family History  Problem Relation Age of Onset  . Diabetes Mother   . Breast cancer Mother   . Diabetes Father   . Heart disease Father   . Colon polyps Maternal Uncle   . Diabetes Maternal Grandmother   . Diabetes Paternal Grandmother   . Colon polyps Maternal Aunt   . Colon cancer Neg Hx   . Esophageal cancer Neg Hx   . Stomach cancer Neg Hx     Social History:   Social History  Substance Use Topics  . Smoking status: Never Smoker   . Smokeless tobacco: Never Used  . Alcohol Use: No    Review of Systems:  Review of Systems  Constitutional: Negative for fever, chills and malaise/fatigue.  HENT: Negative for congestion, ear pain and sore throat.   Eyes: Negative.   Respiratory: Negative for cough, shortness of breath and wheezing.   Cardiovascular: Negative for chest pain, palpitations and leg swelling.  Gastrointestinal: Positive for heartburn. Negative for abdominal pain, diarrhea, constipation, blood in stool and melena.  Genitourinary: Negative.   Musculoskeletal: Positive for joint pain.  Neurological: Negative for dizziness, sensory change, loss of consciousness and headaches.  Psychiatric/Behavioral: Negative for depression. The patient is not nervous/anxious and does not have insomnia.     Physical Exam: Estimated body mass index is 32.7 kg/(m^2) as calculated from the following:   Height as of this encounter: 5\' 8"  (1.727 m).   Weight as of this encounter: 215 lb (97.523 kg). BP 124/82 mmHg  Pulse 70  Temp(Src) 98.2 F  (36.8 C) (Temporal)  Resp 18  Ht 5\' 8"  (1.727 m)  Wt 215 lb (97.523 kg)  BMI 32.70 kg/m2  General Appearance: Well nourished, in no apparent distress.  Eyes: PERRLA, EOMs, conjunctiva no swelling or erythema ENT/Mouth: Ear canals clear bilaterally with no erythema, swelling, discharge.  TMs normal bilaterally with no erythema, bulging, or retractions.  Oropharynx clear and moist with no exudate, swelling, or erythema.  Dentition normal.   Neck: Supple, thyroid normal. No bruits, JVD, cervical adenopathy Respiratory: Respiratory effort normal, BS equal bilaterally without rales, rhonchi, wheezing or stridor.  Cardio: RRR without murmurs, rubs or gallops. Brisk peripheral pulses without edema.  Chest: symmetric, with normal excursions Abdomen: Soft, nontender, no guarding, rebound, hernias, masses, or organomegaly. Genitourinary: Patient preference to defer to Dr. Melford Aase or Dr. Payton Mccallum for prostate exam Musculoskeletal: Full ROM all peripheral extremities,5/5 strength, and normal gait.  Skin: Warm, dry without rashes, lesions, ecchymosis. 4-5 scattered sebborrheic keratoses located on the back Neuro: A&Ox3, Cranial nerves intact, reflexes equal bilaterally. Normal muscle tone, no cerebellar symptoms. Sensation  intact.  Psych: Normal affect, Insight and Judgment appropriate.   EKG: Sinus rhythm with no changes from prior ekg, but does have confirmed QT prolongation which is same from prior  AORTA SCAN: WNL  Over 40 minutes of exam, counseling, chart review and critical decision making was performed  Starlyn Skeans 10:17 AM [facility]

## 2016-03-09 NOTE — Patient Instructions (Addendum)
Please try tumeric with black pepper extract twice daily to see if this will help with your arthritis and joint pains.    Epley Maneuver Self-Care WHAT IS THE EPLEY MANEUVER? The Epley maneuver is an exercise you can do to relieve symptoms of benign paroxysmal positional vertigo (BPPV). This condition is often just referred to as vertigo. BPPV is caused by the movement of tiny crystals (canaliths) inside your inner ear. The accumulation and movement of canaliths in your inner ear causes a sudden spinning sensation (vertigo) when you move your head to certain positions. Vertigo usually lasts about 30 seconds. BPPV usually occurs in just one ear. If you get vertigo when you lie on your left side, you probably have BPPV in your left ear. Your health care provider can tell you which ear is involved.  BPPV may be caused by a head injury. Many people older than 50 get BPPV for unknown reasons. If you have been diagnosed with BPPV, your health care provider may teach you how to do this maneuver. BPPV is not life threatening (benign) and usually goes away in time.  WHEN SHOULD I PERFORM THE EPLEY MANEUVER? You can do this maneuver at home whenever you have symptoms of vertigo. You may do the Epley maneuver up to 3 times a day until your symptoms of vertigo go away. HOW SHOULD I DO THE EPLEY MANEUVER? 1. Sit on the edge of a bed or table with your back straight. Your legs should be extended or hanging over the edge of the bed or table.  2. Turn your head halfway toward the affected ear.  3. Lie backward quickly with your head turned until you are lying flat on your back. You may want to position a pillow under your shoulders.  4. Hold this position for 30 seconds. You may experience an attack of vertigo. This is normal. Hold this position until the vertigo stops. 5. Then turn your head to the opposite direction until your unaffected ear is facing the floor.  6. Hold this position for 30 seconds. You may  experience an attack of vertigo. This is normal. Hold this position until the vertigo stops. 7. Now turn your whole body to the same side as your head. Hold for another 30 seconds.  8. You can then sit back up. ARE THERE RISKS TO THIS MANEUVER? In some cases, you may have other symptoms (such as changes in your vision, weakness, or numbness). If you have these symptoms, stop doing the maneuver and call your health care provider. Even if doing these maneuvers relieves your vertigo, you may still have dizziness. Dizziness is the sensation of light-headedness but without the sensation of movement. Even though the Epley maneuver may relieve your vertigo, it is possible that your symptoms will return within 5 years. WHAT SHOULD I DO AFTER THIS MANEUVER? After doing the Epley maneuver, you can return to your normal activities. Ask your doctor if there is anything you should do at home to prevent vertigo. This may include:  Sleeping with two or more pillows to keep your head elevated.  Not sleeping on the side of your affected ear.  Getting up slowly from bed.  Avoiding sudden movements during the day.  Avoiding extreme head movement, like looking up or bending over.  Wearing a cervical collar to prevent sudden head movements. WHAT SHOULD I DO IF MY SYMPTOMS GET WORSE? Call your health care provider if your vertigo gets worse. Call your provider right way if you have  other symptoms, including:   Nausea.  Vomiting.  Headache.  Weakness.  Numbness.  Vision changes.   This information is not intended to replace advice given to you by your health care provider. Make sure you discuss any questions you have with your health care provider.   Document Released: 11/28/2013 Document Reviewed: 11/28/2013 Elsevier Interactive Patient Education [employer].

## 2016-03-10 LAB — URINALYSIS, ROUTINE W REFLEX MICROSCOPIC
Bilirubin Urine: NEGATIVE
GLUCOSE, UA: NEGATIVE
HGB URINE DIPSTICK: NEGATIVE
Ketones, ur: NEGATIVE
LEUKOCYTES UA: NEGATIVE
NITRITE: NEGATIVE
PROTEIN: NEGATIVE
Specific Gravity, Urine: 1.006 (ref 1.001–1.035)
pH: 7 (ref 5.0–8.0)

## 2016-03-10 LAB — PSA: PSA: 1.28 ng/mL (ref <=4.00)

## 2016-03-10 LAB — VITAMIN D 25 HYDROXY (VIT D DEFICIENCY, FRACTURES): Vit D, 25-Hydroxy: 70 ng/mL (ref 30–100)

## 2016-03-10 LAB — INSULIN, RANDOM: Insulin: 5.8 u[IU]/mL (ref 2.0–19.6)

## 2016-03-10 LAB — MICROALBUMIN / CREATININE URINE RATIO
CREATININE, URINE: 42 mg/dL (ref 20–370)
Microalb, Ur: <0.2 mg/dL

## 2016-03-10 LAB — HEPATITIS C ANTIBODY: HCV Ab: NEGATIVE

## 2016-12-17 ENCOUNTER — Encounter: Payer: Self-pay | Admitting: Gastroenterology

## 2017-03-10 ENCOUNTER — Encounter: Payer: Self-pay | Admitting: Internal Medicine

## 2017-03-18 ENCOUNTER — Ambulatory Visit (INDEPENDENT_AMBULATORY_CARE_PROVIDER_SITE_OTHER): Payer: Managed Care, Other (non HMO) | Admitting: Physician Assistant

## 2017-03-18 ENCOUNTER — Encounter: Payer: Self-pay | Admitting: Physician Assistant

## 2017-03-18 VITALS — BP 128/82 | HR 81 | Temp 97.9°F | Resp 14 | Ht 69.0 in | Wt 220.6 lb

## 2017-03-18 DIAGNOSIS — K2100 Gastro-esophageal reflux disease with esophagitis, without bleeding: Secondary | ICD-10-CM

## 2017-03-18 DIAGNOSIS — Z131 Encounter for screening for diabetes mellitus: Secondary | ICD-10-CM

## 2017-03-18 DIAGNOSIS — Z125 Encounter for screening for malignant neoplasm of prostate: Secondary | ICD-10-CM | POA: Diagnosis not present

## 2017-03-18 DIAGNOSIS — M545 Low back pain, unspecified: Secondary | ICD-10-CM

## 2017-03-18 DIAGNOSIS — Z0000 Encounter for general adult medical examination without abnormal findings: Secondary | ICD-10-CM

## 2017-03-18 DIAGNOSIS — Z79899 Other long term (current) drug therapy: Secondary | ICD-10-CM

## 2017-03-18 DIAGNOSIS — E669 Obesity, unspecified: Secondary | ICD-10-CM | POA: Insufficient documentation

## 2017-03-18 DIAGNOSIS — I10 Essential (primary) hypertension: Secondary | ICD-10-CM

## 2017-03-18 DIAGNOSIS — Z13220 Encounter for screening for lipoid disorders: Secondary | ICD-10-CM

## 2017-03-18 DIAGNOSIS — E6609 Other obesity due to excess calories: Secondary | ICD-10-CM

## 2017-03-18 DIAGNOSIS — Z136 Encounter for screening for cardiovascular disorders: Secondary | ICD-10-CM

## 2017-03-18 DIAGNOSIS — E349 Endocrine disorder, unspecified: Secondary | ICD-10-CM

## 2017-03-18 DIAGNOSIS — N529 Male erectile dysfunction, unspecified: Secondary | ICD-10-CM

## 2017-03-18 DIAGNOSIS — Z130 Encounter for screening for diseases of the blood and blood-forming organs and certain disorders involving the immune mechanism: Secondary | ICD-10-CM

## 2017-03-18 DIAGNOSIS — E559 Vitamin D deficiency, unspecified: Secondary | ICD-10-CM

## 2017-03-18 DIAGNOSIS — Z1329 Encounter for screening for other suspected endocrine disorder: Secondary | ICD-10-CM

## 2017-03-18 DIAGNOSIS — Z1389 Encounter for screening for other disorder: Secondary | ICD-10-CM

## 2017-03-18 DIAGNOSIS — Z6832 Body mass index (BMI) 32.0-32.9, adult: Secondary | ICD-10-CM

## 2017-03-18 LAB — CBC WITH DIFFERENTIAL/PLATELET
BASOS PCT: 0 %
Basophils Absolute: 0 cells/uL (ref 0–200)
EOS PCT: 3 %
Eosinophils Absolute: 150 cells/uL (ref 15–500)
HEMATOCRIT: 44.5 % (ref 38.5–50.0)
Hemoglobin: 15.2 g/dL (ref 13.2–17.1)
LYMPHS PCT: 30 %
Lymphs Abs: 1500 cells/uL (ref 850–3900)
MCH: 31.6 pg (ref 27.0–33.0)
MCHC: 34.2 g/dL (ref 32.0–36.0)
MCV: 92.5 fL (ref 80.0–100.0)
MONO ABS: 350 {cells}/uL (ref 200–950)
MPV: 9.2 fL (ref 7.5–12.5)
Monocytes Relative: 7 %
Neutro Abs: 3000 cells/uL (ref 1500–7800)
Neutrophils Relative %: 60 %
PLATELETS: 178 10*3/uL (ref 140–400)
RBC: 4.81 MIL/uL (ref 4.20–5.80)
RDW: 14 % (ref 11.0–15.0)
WBC: 5 10*3/uL (ref 3.8–10.8)

## 2017-03-18 LAB — HEPATIC FUNCTION PANEL
ALK PHOS: 65 U/L (ref 40–115)
ALT: 21 U/L (ref 9–46)
AST: 21 U/L (ref 10–35)
Albumin: 4.2 g/dL (ref 3.6–5.1)
BILIRUBIN DIRECT: 0.1 mg/dL (ref <=0.2)
BILIRUBIN INDIRECT: 0.6 mg/dL (ref 0.2–1.2)
BILIRUBIN TOTAL: 0.7 mg/dL (ref 0.2–1.2)
Total Protein: 6.7 g/dL (ref 6.1–8.1)

## 2017-03-18 LAB — BASIC METABOLIC PANEL WITH GFR
BUN: 14 mg/dL (ref 7–25)
CALCIUM: 9 mg/dL (ref 8.6–10.3)
CO2: 23 mmol/L (ref 20–31)
Chloride: 107 mmol/L (ref 98–110)
Creat: 1.19 mg/dL (ref 0.70–1.33)
GFR, EST AFRICAN AMERICAN: 77 mL/min (ref >=60)
GFR, EST NON AFRICAN AMERICAN: 67 mL/min (ref >=60)
GLUCOSE: 87 mg/dL (ref 65–99)
POTASSIUM: 3.9 mmol/L (ref 3.5–5.3)
SODIUM: 139 mmol/L (ref 135–146)

## 2017-03-18 LAB — LIPID PANEL
CHOL/HDL RATIO: 3.2 ratio (ref <5.0)
CHOLESTEROL: 147 mg/dL (ref <200)
HDL: 46 mg/dL (ref >40)
LDL CALC: 75 mg/dL (ref <100)
TRIGLYCERIDES: 131 mg/dL (ref <150)
VLDL: 26 mg/dL (ref <30)

## 2017-03-18 LAB — IRON AND TIBC
%SAT: 22 % (ref 15–60)
Iron: 81 ug/dL (ref 50–180)
TIBC: 370 ug/dL (ref 250–425)
UIBC: 289 ug/dL (ref 125–400)

## 2017-03-18 LAB — TSH: TSH: 1.49 mIU/L (ref 0.40–4.50)

## 2017-03-18 MED ORDER — MELOXICAM 15 MG PO TABS: ORAL_TABLET | ORAL | 1 refills | Status: DC

## 2017-03-18 NOTE — Patient Instructions (Signed)
Simple math prevails.    1st - exercise does not produce significant weight loss - at best one converts fat into muscle , "bulks up", loses inches, but usually stays "weight neutral"     2nd - think of your body weightas a check book: If you eat more calories than you burn up - you save money or gain weight .... Or if you spend more money than you put in the check book, ie burn up more calories than you eat, then you lose weight     3rd - if you walk or run 1 mile, you burn up 100 calories - you have to burn up 3,500 calories to lose 1 pound, ie you have to walk/run 35 miles to lose 1 measly pound. So if you want to lose 10 #, then you have to walk/run 350 miles, so.... clearly exercise is not the solution.     4. So if you consume 1,500 calories, then you have to burn up the equivalent of 15 miles to stay weight neutral - It also stands to reason that if you consume 1,500 cal/day and don't lose weight, then you must be burning up about 1,500 cals/day to stay weight neutral.     5. If you really want to lose weight, you must cut your calorie intake 300 calories /day and at that rate you should lose about 1 # every 3 days.   6. Please purchase Dr Fara Olden Fuhrman's book(s) "The End of Dieting" & "Eat to Live" . It has some great concepts and recipes.     Get on mobic x 2 weeks, take with food, may need to take antiinflammatory daily If it is not better can refer to ortho and get xray   Piriformis Syndrome Piriformis syndrome is a condition that can cause pain and numbness in your buttocks and down the back of your leg. Piriformis syndrome happens when the small muscle that connects the base of your spine to your hip (piriformis muscle) presses on the nerve that runs down the back of your leg (sciatic nerve). The piriformis muscle helps your hip rotate and helps to bring your leg back and out. It also helps shift your weight while you are walking to keep you stable. The sciatic nerve runs under or  through the piriformis. Damage to the piriformis muscle can cause spasms that put pressure on the nerve below. This causes pain and discomfort while sitting and moving. The pain may feel as if it begins in the buttock and spreads (radiates) down your hip and thigh. What are the causes? This condition is caused by pressure on the sciatic nerve from the piriformis muscle. The piriformis muscle can get irritated with overuse, especially if other hip muscles are weak and the piriformis has to do extra work. Piriformis syndrome can also occur after an injury, like a fall onto your buttocks. What increases the risk? This condition is more likely to develop in:  Women.  People who sit for long periods of time.  Cyclists.  People who have weak buttocks muscles (gluteal muscles). What are the signs or symptoms? Pain, tingling, or numbness that starts in the buttock and runs down the back of your leg (sciatica) is the most common symptom of this condition. Your symptoms may:  Get worse the longer you sit.  Get worse when you walk, run, or go up on stairs. How is this diagnosed? This condition is diagnosed based on your symptoms, medical history, and physical exam. During  this exam, your health care provider may move your leg into different positions to check for pain. He or she will also press on the muscles of your hip and buttock to see if that increases your symptoms. You may also have an X-ray or MRI. How is this treated? Treatment for this condition may include:  Stopping all activities that cause pain or make your condition worse.  Using heat or ice to relieve pain as told by your health care provider.  Taking medicines to reduce pain and swelling.  Taking a muscle relaxer to release the piriformis muscle.  Doing range-of-motion and strengthening exercises (physical therapy) as told by your health care provider.  Massaging the affected area.  Getting an injection of an  anti-inflammatory medicine or muscle relaxer to reduce inflammation and muscle tension. In rare cases, you may need surgery to cut the muscle and release pressure on the nerve if other treatments do not work. Follow these instructions at home:  Take over-the-counter and prescription medicines only as told by your health care provider.  Do not sit for long periods. Get up and walk around every 20 minutes or as often as told by your health care provider.  If directed, apply heat to the affected area as often as told by your health care provider. Use the heat source that your health care provider recommends, such as a moist heat pack or a heating pad.  Place a towel between your skin and the heat source.  Leave the heat on for 20-30 minutes.  Remove the heat if your skin turns bright red. This is especially important if you are unable to feel pain, heat, or cold. You may have a greater risk of getting burned.  If directed, apply ice to the injured area.  Put ice in a plastic bag.  Place a towel between your skin and the bag.  Leave the ice on for 20 minutes, 2-3 times a day.  Do exercises as told by your health care provider.  Return to your normal activities as told by your health care provider. Ask your health care provider what activities are safe for you.  Keep all follow-up visits as told by your health care provider. This is important. How is this prevented?  Do not sit for longer than 20 minutes at a time. When you sit, choose padded surfaces.  Warm up and stretch before being active.  Cool down and stretch after being active.  Give your body time to rest between periods of activity.  Make sure to use equipment that fits you.  Maintain physical fitness, including:  Strength.  Flexibility. Contact a health care provider if:  Your pain and stiffness continue or get worse.  Your leg or hip becomes weak.  You have changes in your bowel function or bladder  function. This information is not intended to replace advice given to you by your health care provider. Make sure you discuss any questions you have with your health care provider. Document Released: 11/23/2005 Document Revised: 07/28/2016 Document Reviewed: 11/05/2015 Elsevier Interactive Patient Education  2017 [employer]    Piriformis Syndrome Rehab Ask your health care provider which exercises are safe for you. Do exercises exactly as told by your health care provider and adjust them as directed. It is normal to feel mild stretching, pulling, tightness, or discomfort as you do these exercises, but you should stop right away if you feel sudden pain or your pain gets worse.Do not begin these exercises until told  by your health care provider. Stretching and range of motion exercises These exercises warm up your muscles and joints and improve the movement and flexibility of your hip and pelvis. These exercises also help to relieve pain, numbness, and tingling. Exercise A: Hip rotators   1. Lie on your back on a firm surface. 2. Pull your left / right knee toward your same shoulder with your left / right hand until your knee is pointing toward the ceiling. Hold your left / right ankle with your other hand. 3. Keeping your knee steady, gently pull your left / right ankle toward your other shoulder until you feel a stretch in your buttocks. 4. Hold this position for __________ seconds. Repeat __________ times. Complete this stretch __________ times a day. Exercise B: Hip extensors  1. Lie on your back on a firm surface. Both of your legs should be straight. 2. Pull your left / right knee to your chest. Hold your leg in this position by holding onto the back of your thigh or the front of your knee. 3. Hold this position for __________ seconds. 4. Slowly return to the starting position. Repeat __________ times. Complete this stretch __________ times a day. Strengthening exercises These  exercises build strength and endurance in your hip and thigh muscles. Endurance is the ability to use your muscles for a long time, even after they get tired. Exercise C: Straight leg raises (  hip abductors) 1. Lie on your side with your left / right leg in the top position. Lie so your head, shoulder, knee, and hip line up. Bend your bottom knee to help you balance. 2. Lift your top leg up 4-6 inches (10-15 cm), keeping your toes pointed straight ahead. 3. Hold this position for __________ seconds. 4. Slowly lower your leg to the starting position. Let your muscles relax completely. Repeat __________ times. Complete this exercise__________ times a day. Exercise D: Hip abductors and rotators, quadruped  1. Get on your hands and knees on a firm, lightly padded surface. Your hands should be directly below your shoulders, and your knees should be directly below your hips. 2. Lift your left / right knee out to the side. Keep your knee bent. Do not twist your body. 3. Hold this position for __________ seconds. 4. Slowly lower your leg. Repeat __________ times. Complete this exercise__________ times a day. Exercise E: Straight leg raises ( hip extensors) 1. Lie on your abdomen on a bed or a firm surface with a pillow under your hips. 2. Squeeze your buttock muscles and lift your left / right thigh off the bed. Do not let your back arch. 3. Hold this position for __________ seconds. 4. Slowly return to the starting position. Let your muscles relax completely before doing another repetition. Repeat __________ times. Complete this exercise__________ times a day. This information is not intended to replace advice given to you by your health care provider. Make sure you discuss any questions you have with your health care provider. Document Released: 11/23/2005 Document Revised: 07/28/2016 Document Reviewed: 11/05/2015 Elsevier Interactive Patient Education  2017 [employer]  is when the fluid-filled sac (bursa) that covers and protects a joint is swollen (inflamed). Bursitis is most common near joints, especially the knees, elbows, hips, and shoulders. Follow these instructions at home:  Take medicines only as told by your doctor.  If you were prescribed an antibiotic medicine, finish it all even if you start to feel better.  Rest the affected area  as told by your doctor.  Keep the area raised up.  Avoid doing things that make the pain worse.  Apply ice to the injured area:  Place ice in a plastic bag.  Place a towel between your skin and the bag.  Leave the ice on for 20 minutes, 2-3 times a day.  Use splints, braces, pads, or walking aids as told by your doctor.  Keep all follow-up visits as told by your doctor. This is important. Contact a doctor if:  You have more pain with home care.  You have a fever.  You have chills. This information is not intended to replace advice given to you by your health care provider. Make sure you discuss any questions you have with your health care provider. Document Released: 05/13/2010 Document Revised: 04/30/2016 Document Reviewed: 02/12/2014 Elsevier Interactive Patient Education  2017 [employer].

## 2017-03-18 NOTE — Progress Notes (Signed)
Complete Physical  Assessment and Plan:   Encounter for general adult medical examination with abnormal findings - CBC with Differential/Platelet - BASIC METABOLIC PANEL WITH GFR - Hepatic function panel - Magnesium  Screening for diabetes mellitus - Hemoglobin A1c - Insulin, random  Screening for hyperlipidemia - Lipid panel  Screening for thyroid disorder - TSH  Screening for hematuria or proteinuria - Urinalysis, Routine w reflex microscopic (not at [facility]) - Microalbumin / creatinine urine ratio  Screening for deficiency anemia - Iron and TIBC - Vitamin B12  Screening for cardiovascular condition - EKG 12-Lead - [place], RETROPERITNL ABD,  LTD  Screening for prostate cancer - PSA  Testosterone deficiency - Testosterone  Vitamin D deficiency - VITAMIN D 25 Hydroxy (Vit-D Deficiency, Fractures)  Class 1 obesity due to excess calories without serious comorbidity with body mass index (BMI) of 32.0 to 32.9 in adult -     TSH  Acute left-sided low back pain without sciatica -     meloxicam (MOBIC) 15 MG tablet; Take one daily with food for 2 weeks, can take with tylenol, can not take with aleve, iburpofen, then as needed daily for pain  Medication management -     CBC with Differential/Platelet -     BASIC METABOLIC PANEL WITH GFR -     Hepatic function panel -     Magnesium   Discussed med's effects and SE's. Screening labs and tests as requested with regular follow-up as recommended.  HPI Patient presents for a complete physical.   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  He denies chest pain, shortness of breath, dizziness.   He is on cholesterol medication and denies myalgias. His cholesterol is at goal. The cholesterol last visit was:   Lab Results  Component Value Date   CHOL 146 03/09/2016   HDL 41  03/09/2016   LDLCALC 77 03/09/2016   TRIG 139 03/09/2016   CHOLHDL 3.6 03/09/2016   He has been working on diet and exercise for prediabetes, and denies foot ulcerations, hyperglycemia, hypoglycemia , increased appetite, nausea, paresthesia of the feet, polydipsia, polyuria, visual disturbances, vomiting and weight loss. Last A1C in the office was:  Lab Results  Component Value Date   HGBA1C 5.6 03/09/2016   Patient is on Vitamin D supplement.   Lab Results  Component Value Date   VD25OH 70 03/09/2016   Last PSA was: Lab Results  Component Value Date   PSA 1.28 03/09/2016  .  Denies BPH symptoms daytime frequency, double voiding, dysuria, hematuria, hesitancy, incontinence, intermittency, nocturia, sensation of incomplete bladder emptying, suprapubic pain, urgency or weak urinary stream.  He has left hip/back pain with radiation down left lateral leg intermittent x years, worse x1-2 months, no injury, hurts when he lays on that side, worse with sitting on harder surface, has done sporadic ibuprofen 400 AM/PM, helps some, and has some left shoulder pain, worse with rest. Patient denies fever, hematuria, incontinence, numbness, tingling, weakness and saddle anesthesia  BMI is Body mass index is 32.58 kg/m., he is working on diet and exercise. Wt Readings from Last 3 Encounters:  03/18/17 220 lb 9.6 oz (100.1 kg)  03/09/16 215 lb (97.5 kg)  03/07/15 218 lb (98.9 kg)    Current Medications:  Current Outpatient Prescriptions on File Prior to Visit  Medication Sig Dispense Refill  .  Cholecalciferol (VITAMIN D3) 2000 UNITS TABS Take 3 capsules by mouth 3 (three) times daily.    Marland Kitchen ibuprofen (ADVIL,MOTRIN) 200 MG tablet Take 200 mg by mouth every 6 (six) hours as needed.    . Magnesium 400 MG CAPS Take 400 mg by mouth daily.    . Multiple Vitamins-Minerals (MULTIVITAMIN WITH MINERALS) tablet Take 1 tablet by mouth daily.    . tadalafil (CIALIS) 20 MG tablet Take 1 tablet (20 mg total) by  mouth daily as needed for erectile dysfunction. 10 tablet 1  . vitamin C (ASCORBIC ACID) 500 MG tablet Take 500 mg by mouth daily.     No current facility-administered medications on file prior to visit.     Health Maintenance:  Immunization History  Administered Date(s) Administered  . Influenza-Unspecified 09/06/2013  . PPD Test 03/02/2014  . Td 08/27/2005  . Tdap 02/01/2013   Tetanus: 2014 Flu vaccine: 09/06/16, at work Colonoscopy: 2013 Eye Exam:  Dr. Peter Garter , Glasses, goes yearly Dentist: Dr. Philipp Ovens, twice yearly  Patient Care Team: Unk Pinto, MD as PCP - General (Internal Medicine) Rutherford Guys, MD as Consulting Physician (Ophthalmology) Fay Records, MD as Consulting Physician (Cardiology) Ladene Artist, MD as Consulting Physician (Gastroenterology) Franchot Gallo, MD as Consulting Physician (Urology) Roseanne Kaufman, MD as Consulting Physician (Orthopedic Surgery)  Medical History:  Past Medical History:  Diagnosis Date  . Arthritis   . Arthritis   . Carpal tunnel syndrome on both sides   . Erectile dysfunction   . GERD (gastroesophageal reflux disease)    Allergies No Known Allergies  SURGICAL HISTORY He  has a past surgical history that includes Tonsillectomy. FAMILY HISTORY His family history includes Breast cancer in his mother; Colon polyps in his maternal aunt and maternal uncle; Diabetes in his father, maternal grandmother, mother, and paternal grandmother; Heart disease in his father. SOCIAL HISTORY He  reports that he has never smoked. He has never used smokeless tobacco. He reports that he does not drink alcohol or use drugs.   Review of Systems:  Review of Systems  Constitutional: Negative for chills, fever and malaise/fatigue.  HENT: Negative for congestion, ear pain and sore throat.   Eyes: Negative.   Respiratory: Negative for cough, shortness of breath and wheezing.   Cardiovascular: Negative for chest pain, palpitations and leg  swelling.  Gastrointestinal: Positive for heartburn. Negative for abdominal pain, blood in stool, constipation, diarrhea and melena.  Genitourinary: Negative.   Musculoskeletal: Positive for back pain and joint pain.  Neurological: Negative for dizziness, sensory change, loss of consciousness and headaches.  Psychiatric/Behavioral: Negative for depression. The patient is not nervous/anxious and does not have insomnia.     Physical Exam: Estimated body mass index is 32.58 kg/m as calculated from the following:   Height as of this encounter: 5\' 9"  (1.753 m).   Weight as of this encounter: 220 lb 9.6 oz (100.1 kg). BP 128/82   Pulse 81   Temp 97.9 F (36.6 C)   Resp 14   Ht 5\' 9"  (1.753 m)   Wt 220 lb 9.6 oz (100.1 kg)   SpO2 95%   BMI 32.58 kg/m   General Appearance: Well nourished, in no apparent distress.  Eyes: PERRLA, EOMs, conjunctiva no swelling or erythema ENT/Mouth: Ear canals clear bilaterally with no erythema, swelling, discharge.  TMs normal bilaterally with no erythema, bulging, or retractions.  Oropharynx clear and moist with no exudate, swelling, or erythema.  Dentition normal.   Neck: Supple, thyroid normal. No  bruits, JVD, cervical adenopathy Respiratory: Respiratory effort normal, BS equal bilaterally without rales, rhonchi, wheezing or stridor.  Cardio: RRR without murmurs, rubs or gallops. Brisk peripheral pulses without edema.  Chest: symmetric, with normal excursions Abdomen: Soft, nontender, no guarding, rebound, hernias, masses, or organomegaly. Genitourinary: Patient preference to defer to Dr. Melford Aase or Dr. Payton Mccallum for prostate exam Musculoskeletal: Full ROM all peripheral extremities,5/5 strength, and normal gait.  Skin: Warm, dry without rashes, lesions, ecchymosis. 4-5 scattered sebborrheic keratoses located on the back Neuro: A&Ox3, Cranial nerves intact, reflexes equal bilaterally. Normal muscle tone, no cerebellar symptoms. Sensation intact.  Psych:  Normal affect, Insight and Judgment appropriate.   EKG: Sinus rhythm with no changes from prior ekg, but does have confirmed QT prolongation which is same from prior  AORTA SCAN: WNL  Over 40 minutes of exam, counseling, chart review and critical decision making was performed  Vicie Mutters 10:08 AM [facility]

## 2017-03-19 LAB — URINALYSIS, ROUTINE W REFLEX MICROSCOPIC
BILIRUBIN URINE: NEGATIVE
GLUCOSE, UA: NEGATIVE
Hgb urine dipstick: NEGATIVE
KETONES UR: NEGATIVE
Leukocytes, UA: NEGATIVE
Nitrite: NEGATIVE
Protein, ur: NEGATIVE
SPECIFIC GRAVITY, URINE: 1.004 (ref 1.001–1.035)
pH: 6 (ref 5.0–8.0)

## 2017-03-19 LAB — VITAMIN D 25 HYDROXY (VIT D DEFICIENCY, FRACTURES): Vit D, 25-Hydroxy: 74 ng/mL (ref 30–100)

## 2017-03-19 LAB — MICROALBUMIN / CREATININE URINE RATIO
Creatinine, Urine: 33 mg/dL (ref 20–370)
Microalb, Ur: <0.2 mg/dL

## 2017-03-19 LAB — VITAMIN B12: Vitamin B-12: 592 pg/mL (ref 200–1100)

## 2017-03-19 LAB — HEMOGLOBIN A1C
Hgb A1c MFr Bld: 4.9 % (ref <5.7)
Mean Plasma Glucose: 94 mg/dL

## 2017-03-19 LAB — TESTOSTERONE: TESTOSTERONE: 469 ng/dL (ref 250–827)

## 2017-03-19 LAB — MAGNESIUM: Magnesium: 2 mg/dL (ref 1.5–2.5)

## 2017-03-19 LAB — FERRITIN: Ferritin: 44 ng/mL (ref 20–380)

## 2017-03-19 LAB — PSA: PSA: 0.9 ng/mL (ref <=4.0)

## 2017-05-11 ENCOUNTER — Other Ambulatory Visit: Payer: Self-pay | Admitting: Physician Assistant

## 2017-05-11 DIAGNOSIS — M545 Low back pain, unspecified: Secondary | ICD-10-CM

## 2018-03-20 NOTE — Progress Notes (Signed)
Complete Physical  Assessment and Plan:   Encounter for general adult medical examination with abnormal findings Needs to get colonoscopy - CBC with Differential/Platelet - BASIC METABOLIC PANEL WITH GFR - Hepatic function panel - Magnesium  Screening for hyperlipidemia - Lipid panel  Screening for hematuria or proteinuria - Urinalysis, Routine w reflex microscopic (not at [facility]) - Microalbumin / creatinine urine ratio  Screening for cardiovascular condition - EKG 12-Lead  Screening for prostate cancer - PSA  Vitamin D deficiency - VITAMIN D 25 Hydroxy (Vit-D Deficiency, Fractures)  Class 1 obesity due to excess calories without serious comorbidity with body mass index (BMI) of 32.0 to 32.9 in adult -     TSH Discussed disease progression and risks Discussed diet/exercise, weight management and risk modification  Medication management -     CBC with Differential/Platelet -     BASIC METABOLIC PANEL WITH GFR -     Hepatic function panel -     Magnesium Gastroesophageal reflux disease with esophagitis Get off NSAIDS, PPI x 2 weeks, if not better will inform GI so he can get EGD with colonoscopy. Benign AB.   Toenail fungus -     Hepatic function panel; Future -     terbinafine (LAMISIL) 250 MG tablet; Take 1 tablet (250 mg total) by mouth daily. Take one daily for 3 months, need liver function lab at 6 weeks.  Other orders -     loratadine-pseudoephedrine (CLARITIN-D 24 HOUR) 10-240 MG 24 hr tablet; Take 1 tablet by mouth daily. -     omeprazole (PRILOSEC) 40 MG capsule; Take 1 capsule (40 mg total) by mouth daily.  Discussed med's effects and SE's. Screening labs and tests as requested with regular follow-up as recommended.  HPI Patient presents for a complete physical.   He states he has been having epigastric pain x 6-9 months, worse with certain foods, very localized left epigastric pain, states everything gives him GERD now. He will take tums and will take  famotidine/ratinidine. States these help some. No blood in stool, black stool, diarrhea, constipation. He was taking aleve/ibuprofen, but has quit 3 months ago, no ETOH.   He also complains of bilateral toenail fungus, worse on the left than right, has taken lamisil in the past and would like to take it again. Has fungus 3-5th digit on left foot.   His blood pressure has been controlled at home, today their BP is BP: 122/78 He does not workout due to physically active job, Works as Building control surveyor for [employer], no air conditioning in the summer.  He denies chest pain, shortness of breath, dizziness.   He is on cholesterol medication and denies myalgias. His cholesterol is at goal. The cholesterol last visit was:   Lab Results  Component Value Date   CHOL 147 03/18/2017   HDL 46 03/18/2017   LDLCALC 75 03/18/2017   TRIG 131 03/18/2017   CHOLHDL 3.2 03/18/2017   He has been working on diet and exercise for prediabetes, and denies foot ulcerations, hyperglycemia, hypoglycemia , increased appetite, nausea, paresthesia of the feet, polydipsia, polyuria, visual disturbances, vomiting and weight loss. Last A1C in the office was:  Lab Results  Component Value Date   HGBA1C 4.9 03/18/2017   Patient is on Vitamin D supplement.   Lab Results  Component Value Date   VD25OH 74 03/18/2017   Last PSA was normal and he has not BPH symptoms: Lab Results  Component Value Date   PSA 0.9 03/18/2017   BMI  is Body mass index is 33.75 kg/m., he is working on diet and exercise. Wt Readings from Last 3 Encounters:  03/22/18 222 lb (100.7 kg)  03/18/17 220 lb 9.6 oz (100.1 kg)  03/09/16 215 lb (97.5 kg)    Current Medications:  Current Outpatient Medications on File Prior to Visit  Medication Sig Dispense Refill  . Cholecalciferol (VITAMIN D3) 2000 UNITS TABS Take 3 capsules by mouth 3 (three) times daily.    Marland Kitchen ibuprofen (ADVIL,MOTRIN) 200 MG tablet Take 200 mg by mouth every 6 (six) hours as needed.    .  Magnesium 400 MG CAPS Take 400 mg by mouth daily.    . meloxicam (MOBIC) 15 MG tablet 1 TAB DAILY WITH FOOD X 2 WEEKS THEN AS NEEDED DAILY FOR PAIN.NO ALEVE/IBUPROFEN, CAN TAKE TYLENOL 30 tablet 1  . Multiple Vitamins-Minerals (MULTIVITAMIN WITH MINERALS) tablet Take 1 tablet by mouth daily.    . tadalafil (CIALIS) 20 MG tablet Take 1 tablet (20 mg total) by mouth daily as needed for erectile dysfunction. 10 tablet 1  . vitamin C (ASCORBIC ACID) 500 MG tablet Take 500 mg by mouth daily.     No current facility-administered medications on file prior to visit.     Health Maintenance:  Immunization History  Administered Date(s) Administered  . Influenza-Unspecified 09/06/2013  . PPD Test 03/02/2014  . Td 08/27/2005  . Tdap 02/01/2013   Tetanus: 2014 Flu vaccine: 2018, at work Colonoscopy: 2013 due last year, Dr. Fuller Plan Eye Exam:  Dr. Peter Garter , Glasses, goes yearly Dentist: Dr. Philipp Ovens, twice yearly  Patient Care Team: Unk Pinto, MD as PCP - General (Internal Medicine) Rutherford Guys, MD as Consulting Physician (Ophthalmology) Fay Records, MD as Consulting Physician (Cardiology) Ladene Artist, MD as Consulting Physician (Gastroenterology) Franchot Gallo, MD as Consulting Physician (Urology) Roseanne Kaufman, MD as Consulting Physician (Orthopedic Surgery)  Medical History:  Past Medical History:  Diagnosis Date  . Arthritis   . Arthritis   . Carpal tunnel syndrome on both sides   . Erectile dysfunction   . GERD (gastroesophageal reflux disease)    Allergies No Known Allergies  SURGICAL HISTORY He  has a past surgical history that includes Tonsillectomy. FAMILY HISTORY His family history includes Breast cancer in his mother; Colon polyps in his maternal aunt and maternal uncle; Diabetes in his father, maternal grandmother, mother, and paternal grandmother; Heart disease in his father. SOCIAL HISTORY He  reports that he has never smoked. He has never used  smokeless tobacco. He reports that he does not drink alcohol or use drugs.   Review of Systems:  Review of Systems  Constitutional: Negative for chills, fever and malaise/fatigue.  HENT: Negative for congestion, ear pain and sore throat.   Eyes: Negative.   Respiratory: Negative for cough, shortness of breath and wheezing.   Cardiovascular: Negative for chest pain, palpitations and leg swelling.  Gastrointestinal: Positive for heartburn. Negative for abdominal pain, blood in stool, constipation, diarrhea and melena.  Genitourinary: Negative.   Musculoskeletal: Positive for back pain and joint pain.  Neurological: Negative for dizziness, sensory change, loss of consciousness and headaches.  Psychiatric/Behavioral: Negative for depression. The patient is not nervous/anxious and does not have insomnia.     Physical Exam: Estimated body mass index is 33.75 kg/m as calculated from the following:   Height as of this encounter: 5\' 8"  (1.727 m).   Weight as of this encounter: 222 lb (100.7 kg). BP 122/78   Pulse 78   Temp (!)  97.5 F (36.4 C)   Resp 16   Ht 5\' 8"  (1.727 m)   Wt 222 lb (100.7 kg)   SpO2 97%   BMI 33.75 kg/m   General Appearance: Well nourished, in no apparent distress.  Eyes: PERRLA, EOMs, conjunctiva no swelling or erythema ENT/Mouth: Ear canals clear bilaterally with no erythema, swelling, discharge.  TMs normal bilaterally with no erythema, bulging, or retractions.  Oropharynx clear and moist with no exudate, swelling, or erythema.  Dentition normal.   Neck: Supple, thyroid normal. No bruits, JVD, cervical adenopathy Respiratory: Respiratory effort normal, BS equal bilaterally without rales, rhonchi, wheezing or stridor.  Cardio: RRR without murmurs, rubs or gallops. Brisk peripheral pulses without edema.  Chest: symmetric, with normal excursions Abdomen: Soft, nontender, no guarding, rebound, hernias, masses, or organomegaly. Genitourinary: Patient preference to  defer to Dr. Melford Aase or Dr. Payton Mccallum for prostate exam Musculoskeletal: Full ROM all peripheral extremities,5/5 strength, and normal gait.  Skin: Warm, dry without rashes, lesions, ecchymosis. 4-5 scattered sebborrheic keratoses located on the back Neuro: A&Ox3, Cranial nerves intact, reflexes equal bilaterally. Normal muscle tone, no cerebellar symptoms. Sensation intact.  Psych: Normal affect, Insight and Judgment appropriate.   EKG: Sinus rhythm with no changes from prior ekg, PVCs  AORTA SCAN: defer  Over 40 minutes of exam, counseling, chart review and critical decision making was performed  Vicie Mutters 9:58 AM [facility]

## 2018-03-22 ENCOUNTER — Ambulatory Visit (INDEPENDENT_AMBULATORY_CARE_PROVIDER_SITE_OTHER): Payer: 59 | Admitting: Physician Assistant

## 2018-03-22 ENCOUNTER — Encounter: Payer: Self-pay | Admitting: Physician Assistant

## 2018-03-22 VITALS — BP 122/78 | HR 78 | Temp 97.5°F | Resp 16 | Ht 68.0 in | Wt 222.0 lb

## 2018-03-22 DIAGNOSIS — G5603 Carpal tunnel syndrome, bilateral upper limbs: Secondary | ICD-10-CM

## 2018-03-22 DIAGNOSIS — Z6832 Body mass index (BMI) 32.0-32.9, adult: Secondary | ICD-10-CM

## 2018-03-22 DIAGNOSIS — Z125 Encounter for screening for malignant neoplasm of prostate: Secondary | ICD-10-CM | POA: Diagnosis not present

## 2018-03-22 DIAGNOSIS — Z0000 Encounter for general adult medical examination without abnormal findings: Secondary | ICD-10-CM | POA: Diagnosis not present

## 2018-03-22 DIAGNOSIS — Z13220 Encounter for screening for lipoid disorders: Secondary | ICD-10-CM | POA: Diagnosis not present

## 2018-03-22 DIAGNOSIS — N529 Male erectile dysfunction, unspecified: Secondary | ICD-10-CM

## 2018-03-22 DIAGNOSIS — Z1329 Encounter for screening for other suspected endocrine disorder: Secondary | ICD-10-CM

## 2018-03-22 DIAGNOSIS — R3915 Urgency of urination: Secondary | ICD-10-CM

## 2018-03-22 DIAGNOSIS — I10 Essential (primary) hypertension: Secondary | ICD-10-CM | POA: Diagnosis not present

## 2018-03-22 DIAGNOSIS — K2100 Gastro-esophageal reflux disease with esophagitis, without bleeding: Secondary | ICD-10-CM

## 2018-03-22 DIAGNOSIS — N401 Enlarged prostate with lower urinary tract symptoms: Secondary | ICD-10-CM

## 2018-03-22 DIAGNOSIS — Z0001 Encounter for general adult medical examination with abnormal findings: Secondary | ICD-10-CM

## 2018-03-22 DIAGNOSIS — Z79899 Other long term (current) drug therapy: Secondary | ICD-10-CM | POA: Diagnosis not present

## 2018-03-22 DIAGNOSIS — B351 Tinea unguium: Secondary | ICD-10-CM

## 2018-03-22 DIAGNOSIS — Z136 Encounter for screening for cardiovascular disorders: Secondary | ICD-10-CM | POA: Diagnosis not present

## 2018-03-22 DIAGNOSIS — E559 Vitamin D deficiency, unspecified: Secondary | ICD-10-CM

## 2018-03-22 DIAGNOSIS — Z1389 Encounter for screening for other disorder: Secondary | ICD-10-CM | POA: Diagnosis not present

## 2018-03-22 DIAGNOSIS — Z6833 Body mass index (BMI) 33.0-33.9, adult: Secondary | ICD-10-CM

## 2018-03-22 DIAGNOSIS — E6609 Other obesity due to excess calories: Secondary | ICD-10-CM

## 2018-03-22 MED ORDER — OMEPRAZOLE 40 MG PO CPDR: 40.0000 mg | DELAYED_RELEASE_CAPSULE | Freq: Every day | ORAL | 0 refills | Status: DC

## 2018-03-22 MED ORDER — TERBINAFINE HCL 250 MG PO TABS: 250.0000 mg | ORAL_TABLET | Freq: Every day | ORAL | 0 refills | Status: AC

## 2018-03-22 MED ORDER — LORATADINE-PSEUDOEPHEDRINE ER 10-240 MG PO TB24: 1.0000 | ORAL_TABLET | Freq: Every day | ORAL | 1 refills | Status: AC

## 2018-03-22 NOTE — Patient Instructions (Addendum)
Take omeprazole over the counter for 2 weeks, then go to zantac 150-300 mg OR pepcid 20 or 40mg  at night for 2 weeks, then you can stop or continue as needed.  Avoid alcohol, spicy foods, NSAIDS (aleve, ibuprofen) at this time. See foods below.   GET COLONOSCOPY- IF YOU ARE STILL HAVING ISSUES AFTER THE PRILOSEC X 2 WEEKS LET GI KNOW AND THEY WILL GET AN EGD AS WELL- OR GO DOWN AND LOOK AT YOUR STOMACH.   Food Choices for Gastroesophageal Reflux Disease When you have gastroesophageal reflux disease (GERD), the foods you eat and your eating habits are very important. Choosing the right foods can help ease the discomfort of GERD. WHAT GENERAL GUIDELINES DO I NEED TO FOLLOW?  Choose fruits, vegetables, whole grains, low-fat dairy products, and low-fat meat, fish, and poultry.  Limit fats such as oils, salad dressings, butter, nuts, and avocado.  Keep a food diary to identify foods that cause symptoms.  Avoid foods that cause reflux. These may be different for different people.  Eat frequent small meals instead of three large meals each day.  Eat your meals slowly, in a relaxed setting.  Limit fried foods.  Cook foods using methods other than frying.  Avoid drinking alcohol.  Avoid drinking large amounts of liquids with your meals.  Avoid bending over or lying down until 2-3 hours after eating. WHAT FOODS ARE NOT RECOMMENDED? The following are some foods and drinks that may worsen your symptoms: Vegetables Tomatoes. Tomato juice. Tomato and spaghetti sauce. Chili peppers. Onion and garlic. Horseradish. Fruits Oranges, grapefruit, and lemon (fruit and juice). Meats High-fat meats, fish, and poultry. This includes hot dogs, ribs, ham, sausage, salami, and bacon. Dairy Whole milk and chocolate milk. Sour cream. Cream. Butter. Ice cream. Cream cheese.  Beverages Coffee and tea, with or without caffeine. Carbonated beverages or energy drinks. Condiments Hot sauce. Barbecue sauce.   Sweets/Desserts Chocolate and cocoa. Donuts. Peppermint and spearmint. Fats and Oils High-fat foods, including [place] fries and potato chips. Other Vinegar. Strong spices, such as black pepper, white pepper, red pepper, cayenne, curry powder, cloves, ginger, and chili powder.  Your ears and sinuses are connected by the eustachian tube. When your sinuses are inflamed, this can close off the tube and cause fluid to collect in your middle ear. This can then cause dizziness, popping, clicking, ringing, and echoing in your ears. This is often NOT an infection and does NOT require antibiotics, it is caused by inflammation so the treatments help the inflammation. This can take a long time to get better so please be patient.  Here are things you can do to help with this: - Try the Flonase or Nasonex. Remember to spray each nostril twice towards the outer part of your eye.  Do not sniff but instead pinch your nose and tilt your head back to help the medicine get into your sinuses.  The best time to do this is at bedtime.Stop if you get blurred vision or nose bleeds.  -While drinking fluids, pinch and hold nose close and swallow, to help open eustachian tubes to drain fluid behind ear drums. -Please pick one of the over the counter allergy medications below and take it once daily for allergies.  It will also help with fluid behind ear drums. Claritin or loratadine cheapest but likely the weakest  Zyrtec or certizine at night because it can make you sleepy The strongest is allegra or fexafinadine  Cheapest at [employer], [employer], [employer] -can use decongestant over  the counter, please do not use if you have high blood pressure or certain heart conditions.   if worsening HA, changes vision/speech, imbalance, weakness go to the ER   VENOUS INSUFFICIENCY Our lower leg venous system is not the most reliable, the heart does NOT pump fluid up, there is a valve system.  The muscles of the leg squeeze and the blood  moves up and a valve opens and close, then they squeeze, blood moves up and valves open and closes keeping the blood moving towards the heart.  Lots can go wrong with this valve system.  If someone is sitting or standing without movement, everyone will get swelling.  THINGS TO DO:  Do not stand or sit in one position for long periods of time. Do not sit with your legs crossed. Rest with your legs raised during the day.  Your legs have to be higher than your heart so that gravity will force the valves to open, so please really elevate your legs.   Wear elastic stockings or support hose. Do not wear other tight, encircling garments around the legs, pelvis, or waist.  [employer]  has a wide variety of well priced compression stockings. [place], [place] [address] [id number] [place] has a good cheap selection, I like the socks, they are not as hard to get on  Walk as much as possible to increase blood flow.  Raise the foot of your bed at night with 2-inch blocks.  SEEK MEDICAL CARE IF:   The skin around your ankle starts to break down.  You have pain, redness, tenderness, or hard swelling developing in your leg over a vein.  You are uncomfortable due to leg pain.  If you ever have shortness of breath with exertion or chest pain go to the ER.    Piriformis Syndrome  Piriformis syndrome is a condition that can cause pain and numbness in your buttocks and down the back of your leg. Piriformis syndrome happens when the small muscle that connects the base of your spine to your hip (piriformis muscle) presses on the nerve that runs down the back of your leg (sciatic nerve). The piriformis muscle helps your hip rotate and helps to bring your leg back and out. It also helps shift your weight while you are walking to keep you stable. The sciatic nerve runs under or through the piriformis. Damage to the piriformis muscle can cause spasms that put pressure on the nerve  below. This causes pain and discomfort while sitting and moving. The pain may feel as if it begins in the buttock and spreads (radiates) down your hip and thigh. What are the causes? This condition is caused by pressure on the sciatic nerve from the piriformis muscle. The piriformis muscle can get irritated with overuse, especially if other hip muscles are weak and the piriformis has to do extra work. Piriformis syndrome can also occur after an injury, like a fall onto your buttocks. What increases the risk? This condition is more likely to develop in:  Women.  People who sit for long periods of time.  Cyclists.  People who have weak buttocks muscles (gluteal muscles). What are the signs or symptoms? Pain, tingling, or numbness that starts in the buttock and runs down the back of your leg (sciatica) is the most common symptom of this condition. Your symptoms may:  Get worse the longer you sit.  Get worse when you walk, run, or go up on  stairs. How is this diagnosed? This condition is diagnosed based on your symptoms, medical history, and physical exam. During this exam, your health care provider may move your leg into different positions to check for pain. He or she will also press on the muscles of your hip and buttock to see if that increases your symptoms. You may also have an X-ray or MRI. How is this treated? Treatment for this condition may include:  Stopping all activities that cause pain or make your condition worse.  Using heat or ice to relieve pain as told by your health care provider.  Taking medicines to reduce pain and swelling.  Taking a muscle relaxer to release the piriformis muscle.  Doing range-of-motion and strengthening exercises (physical therapy) as told by your health care provider.  Massaging the affected area.  Getting an injection of an anti-inflammatory medicine or muscle relaxer to reduce inflammation and muscle tension. In rare cases, you may need  surgery to cut the muscle and release pressure on the nerve if other treatments do not work. Follow these instructions at home:  Take over-the-counter and prescription medicines only as told by your health care provider.  Do not sit for long periods. Get up and walk around every 20 minutes or as often as told by your health care provider.  If directed, apply heat to the affected area as often as told by your health care provider. Use the heat source that your health care provider recommends, such as a moist heat pack or a heating pad. ? Place a towel between your skin and the heat source. ? Leave the heat on for 20-30 minutes. ? Remove the heat if your skin turns bright red. This is especially important if you are unable to feel pain, heat, or cold. You may have a greater risk of getting burned.  If directed, apply ice to the injured area. ? Put ice in a plastic bag. ? Place a towel between your skin and the bag. ? Leave the ice on for 20 minutes, 2-3 times a day.  Do exercises as told by your health care provider.  Return to your normal activities as told by your health care provider. Ask your health care provider what activities are safe for you.  Keep all follow-up visits as told by your health care provider. This is important. How is this prevented?  Do not sit for longer than 20 minutes at a time. When you sit, choose padded surfaces.  Warm up and stretch before being active.  Cool down and stretch after being active.  Give your body time to rest between periods of activity.  Make sure to use equipment that fits you.  Maintain physical fitness, including: ? Strength. ? Flexibility. Contact a health care provider if:  Your pain and stiffness continue or get worse.  Your leg or hip becomes weak.  You have changes in your bowel function or bladder function. This information is not intended to replace advice given to you by your health care provider. Make sure you discuss  any questions you have with your health care provider. Document Released: 11/23/2005 Document Revised: 07/28/2016 Document Reviewed: 11/05/2015 Elsevier Interactive Patient Education  2017 [employer]    Piriformis Syndrome Rehab Ask your health care provider which exercises are safe for you. Do exercises exactly as told by your health care provider and adjust them as directed. It is normal to feel mild stretching, pulling, tightness, or discomfort as you do these exercises, but you should  stop right away if you feel sudden pain or your pain gets worse.Do not begin these exercises until told by your health care provider. Stretching and range of motion exercises These exercises warm up your muscles and joints and improve the movement and flexibility of your hip and pelvis. These exercises also help to relieve pain, numbness, and tingling. Exercise A: Hip rotators    1. Lie on your back on a firm surface. 2. Pull your left / right knee toward your same shoulder with your left / right hand until your knee is pointing toward the ceiling. Hold your left / right ankle with your other hand. 3. Keeping your knee steady, gently pull your left / right ankle toward your other shoulder until you feel a stretch in your buttocks. 4. Hold this position for __________ seconds. Repeat __________ times. Complete this stretch __________ times a day. Exercise B: Hip extensors  1. Lie on your back on a firm surface. Both of your legs should be straight. 2. Pull your left / right knee to your chest. Hold your leg in this position by holding onto the back of your thigh or the front of your knee. 3. Hold this position for __________ seconds. 4. Slowly return to the starting position. Repeat __________ times. Complete this stretch __________ times a day. Strengthening exercises These exercises build strength and endurance in your hip and thigh muscles. Endurance is the ability to use your muscles for a long  time, even after they get tired. Exercise C: Straight leg raises (  hip abductors)    1. Lie on your side with your left / right leg in the top position. Lie so your head, shoulder, knee, and hip line up. Bend your bottom knee to help you balance. 2. Lift your top leg up 4-6 inches (10-15 cm), keeping your toes pointed straight ahead. 3. Hold this position for __________ seconds. 4. Slowly lower your leg to the starting position. Let your muscles relax completely. Repeat __________ times. Complete this exercise__________ times a day. Exercise D: Hip abductors and rotators, quadruped   1. Get on your hands and knees on a firm, lightly padded surface. Your hands should be directly below your shoulders, and your knees should be directly below your hips. 2. Lift your left / right knee out to the side. Keep your knee bent. Do not twist your body. 3. Hold this position for __________ seconds. 4. Slowly lower your leg. Repeat __________ times. Complete this exercise__________ times a day. Exercise E: Straight leg raises ( hip extensors) 1. Lie on your abdomen on a bed or a firm surface with a pillow under your hips. 2. Squeeze your buttock muscles and lift your left / right thigh off the bed. Do not let your back arch. 3. Hold this position for __________ seconds. 4. Slowly return to the starting position. Let your muscles relax completely before doing another repetition. Repeat __________ times. Complete this exercise__________ times a day. This information is not intended to replace advice given to you by your health care provider. Make sure you discuss any questions you have with your health care provider. Document Released: 11/23/2005 Document Revised: 07/28/2016 Document Reviewed: 11/05/2015 Elsevier Interactive Patient Education  2017 [employer].

## 2018-03-23 LAB — CBC WITH DIFFERENTIAL/PLATELET
BASOS ABS: 32 {cells}/uL (ref 0–200)
Basophils Relative: 0.6 %
EOS PCT: 1.3 %
Eosinophils Absolute: 70 cells/uL (ref 15–500)
HCT: 45.7 % (ref 38.5–50.0)
Hemoglobin: 15.7 g/dL (ref 13.2–17.1)
Lymphs Abs: 1199 cells/uL (ref 850–3900)
MCH: 30.8 pg (ref 27.0–33.0)
MCHC: 34.4 g/dL (ref 32.0–36.0)
MCV: 89.8 fL (ref 80.0–100.0)
MPV: 9.8 fL (ref 7.5–12.5)
Monocytes Relative: 8.8 %
NEUTROS PCT: 67.1 %
Neutro Abs: 3623 cells/uL (ref 1500–7800)
Platelets: 181 10*3/uL (ref 140–400)
RBC: 5.09 10*6/uL (ref 4.20–5.80)
RDW: 12.9 % (ref 11.0–15.0)
Total Lymphocyte: 22.2 %
WBC mixed population: 475 cells/uL (ref 200–950)
WBC: 5.4 10*3/uL (ref 3.8–10.8)

## 2018-03-23 LAB — PSA: PSA: 0.9 ng/mL (ref <=4.0)

## 2018-03-23 LAB — HEPATIC FUNCTION PANEL
AG Ratio: 1.9 (calc) (ref 1.0–2.5)
ALBUMIN MSPROF: 4.6 g/dL (ref 3.6–5.1)
ALKALINE PHOSPHATASE (APISO): 78 U/L (ref 40–115)
ALT: 21 U/L (ref 9–46)
AST: 21 U/L (ref 10–35)
BILIRUBIN DIRECT: 0.2 mg/dL (ref 0.0–0.2)
BILIRUBIN TOTAL: 0.8 mg/dL (ref 0.2–1.2)
Globulin: 2.4 g/dL (calc) (ref 1.9–3.7)
Indirect Bilirubin: 0.6 mg/dL (calc) (ref 0.2–1.2)
Total Protein: 7 g/dL (ref 6.1–8.1)

## 2018-03-23 LAB — BASIC METABOLIC PANEL WITH GFR
BUN: 14 mg/dL (ref 7–25)
CALCIUM: 9.4 mg/dL (ref 8.6–10.3)
CHLORIDE: 104 mmol/L (ref 98–110)
CO2: 29 mmol/L (ref 20–32)
Creat: 1.18 mg/dL (ref 0.70–1.33)
GFR, Est African American: 78 mL/min/{1.73_m2} (ref >=60)
GFR, Est Non African American: 67 mL/min/{1.73_m2} (ref >=60)
Glucose, Bld: 88 mg/dL (ref 65–99)
POTASSIUM: 4 mmol/L (ref 3.5–5.3)
SODIUM: 139 mmol/L (ref 135–146)

## 2018-03-23 LAB — MICROALBUMIN / CREATININE URINE RATIO
CREATININE, URINE: 17 mg/dL — AB (ref 20–320)
MICROALB/CREAT RATIO: 12 ug/mg{creat} (ref <30)
Microalb, Ur: 0.2 mg/dL

## 2018-03-23 LAB — TSH: TSH: 1.9 m[IU]/L (ref 0.40–4.50)

## 2018-03-23 LAB — URINALYSIS, ROUTINE W REFLEX MICROSCOPIC
BILIRUBIN URINE: NEGATIVE
Glucose, UA: NEGATIVE
Hgb urine dipstick: NEGATIVE
Ketones, ur: NEGATIVE
LEUKOCYTES UA: NEGATIVE
Nitrite: NEGATIVE
Protein, ur: NEGATIVE
SPECIFIC GRAVITY, URINE: 1.005 (ref 1.001–1.03)
pH: 6.5 (ref 5.0–8.0)

## 2018-03-23 LAB — LIPID PANEL
Cholesterol: 145 mg/dL (ref <200)
HDL: 44 mg/dL (ref >40)
LDL Cholesterol (Calc): 75 mg/dL (calc)
NON-HDL CHOLESTEROL (CALC): 101 mg/dL (ref <130)
TRIGLYCERIDES: 159 mg/dL — AB (ref <150)
Total CHOL/HDL Ratio: 3.3 (calc) (ref <5.0)

## 2018-03-23 LAB — MAGNESIUM: MAGNESIUM: 1.9 mg/dL (ref 1.5–2.5)

## 2018-03-23 LAB — VITAMIN D 25 HYDROXY (VIT D DEFICIENCY, FRACTURES): Vit D, 25-Hydroxy: 76 ng/mL (ref 30–100)

## 2018-05-03 ENCOUNTER — Other Ambulatory Visit: Payer: Self-pay

## 2018-05-05 ENCOUNTER — Other Ambulatory Visit: Payer: 59

## 2018-05-05 DIAGNOSIS — Z79899 Other long term (current) drug therapy: Secondary | ICD-10-CM

## 2018-05-05 DIAGNOSIS — B351 Tinea unguium: Secondary | ICD-10-CM

## 2018-05-05 DIAGNOSIS — E782 Mixed hyperlipidemia: Secondary | ICD-10-CM | POA: Diagnosis not present

## 2018-05-05 DIAGNOSIS — I10 Essential (primary) hypertension: Secondary | ICD-10-CM | POA: Diagnosis not present

## 2018-05-05 LAB — HEPATIC FUNCTION PANEL
AG RATIO: 1.9 (calc) (ref 1.0–2.5)
ALBUMIN MSPROF: 4.4 g/dL (ref 3.6–5.1)
ALT: 21 U/L (ref 9–46)
AST: 18 U/L (ref 10–35)
Alkaline phosphatase (APISO): 71 U/L (ref 40–115)
BILIRUBIN DIRECT: 0.1 mg/dL (ref 0.0–0.2)
BILIRUBIN TOTAL: 0.5 mg/dL (ref 0.2–1.2)
GLOBULIN: 2.3 g/dL (ref 1.9–3.7)
Indirect Bilirubin: 0.4 mg/dL (calc) (ref 0.2–1.2)
Total Protein: 6.7 g/dL (ref 6.1–8.1)

## 2018-07-20 ENCOUNTER — Encounter: Payer: Self-pay | Admitting: Gastroenterology

## 2018-09-19 ENCOUNTER — Ambulatory Visit (AMBULATORY_SURGERY_CENTER): Payer: Self-pay | Admitting: *Deleted

## 2018-09-19 VITALS — Ht 68.5 in | Wt 226.0 lb

## 2018-09-19 DIAGNOSIS — Z86010 Personal history of colonic polyps: Secondary | ICD-10-CM

## 2018-09-19 MED ORDER — NA SULFATE-K SULFATE-MG SULF 17.5-3.13-1.6 GM/177ML PO SOLN: 1.0000 | Freq: Once | ORAL | 0 refills | Status: AC

## 2018-09-19 NOTE — Progress Notes (Signed)
No egg or soy allergy known to patient  No issues with past sedation with any surgeries  or procedures, no intubation problems  No diet pills per patient No home 02 use per patient  No blood thinners per patient  Pt denies issues with constipation  No A fib or A flutter  EMMI video sent to pt's e mail  Suprep $15 coupon to pt   

## 2018-09-20 ENCOUNTER — Encounter: Payer: Self-pay | Admitting: Gastroenterology

## 2018-10-03 ENCOUNTER — Other Ambulatory Visit (HOSPITAL_COMMUNITY)
Admission: RE | Admit: 2018-10-03 | Discharge: 2018-10-03 | Disposition: A | Discharge status: Home / Self Care | Payer: 59 | Source: Ambulatory Visit | Attending: Gastroenterology | Admitting: Gastroenterology

## 2018-10-03 ENCOUNTER — Encounter: Payer: Self-pay | Admitting: Gastroenterology

## 2018-10-03 ENCOUNTER — Ambulatory Visit (AMBULATORY_SURGERY_CENTER): Payer: 59 | Admitting: Gastroenterology

## 2018-10-03 VITALS — BP 124/77 | HR 62 | Temp 98.0°F | Resp 12 | Ht 68.0 in | Wt 222.0 lb

## 2018-10-03 DIAGNOSIS — D128 Benign neoplasm of rectum: Secondary | ICD-10-CM

## 2018-10-03 DIAGNOSIS — Z86010 Personal history of colonic polyps: Secondary | ICD-10-CM | POA: Diagnosis not present

## 2018-10-03 DIAGNOSIS — D125 Benign neoplasm of sigmoid colon: Secondary | ICD-10-CM

## 2018-10-03 DIAGNOSIS — D123 Benign neoplasm of transverse colon: Secondary | ICD-10-CM | POA: Diagnosis not present

## 2018-10-03 DIAGNOSIS — D124 Benign neoplasm of descending colon: Secondary | ICD-10-CM

## 2018-10-03 DIAGNOSIS — D127 Benign neoplasm of rectosigmoid junction: Secondary | ICD-10-CM

## 2018-10-03 HISTORY — PX: COLONOSCOPY: SHX174

## 2018-10-03 MED ORDER — SODIUM CHLORIDE 0.9 % IV SOLN: 500.0000 mL | Freq: Once | INTRAVENOUS | Status: DC

## 2018-10-03 NOTE — Patient Instructions (Signed)
  Please read handouts provided. High fiber diet. Continue present medications.    YOU HAD AN ENDOSCOPIC PROCEDURE TODAY AT [facility]:   Refer to the procedure report that was given to you for any specific questions about what was found during the examination.  If the procedure report does not answer your questions, please call your gastroenterologist to clarify.  If you requested that your care partner not be given the details of your procedure findings, then the procedure report has been included in a sealed envelope for you to review at your convenience later.  YOU SHOULD EXPECT: Some feelings of bloating in the abdomen. Passage of more gas than usual.  Walking can help get rid of the air that was put into your GI tract during the procedure and reduce the bloating. If you had a lower endoscopy (such as a colonoscopy or flexible sigmoidoscopy) you may notice spotting of blood in your stool or on the toilet paper. If you underwent a bowel prep for your procedure, you may not have a normal bowel movement for a few days.  Please Note:  You might notice some irritation and congestion in your nose or some drainage.  This is from the oxygen used during your procedure.  There is no need for concern and it should clear up in a day or so.  SYMPTOMS TO REPORT IMMEDIATELY:   Following lower endoscopy (colonoscopy or flexible sigmoidoscopy):  Excessive amounts of blood in the stool  Significant tenderness or worsening of abdominal pains  Swelling of the abdomen that is new, acute  Fever of 100F or higher    For urgent or emergent issues, a gastroenterologist can be reached at any hour by calling [phone].   DIET:  We do recommend a small meal at first, but then you may proceed to your regular diet.  Drink plenty of fluids but you should avoid alcoholic beverages for 24 hours.  ACTIVITY:  You should plan to take it easy for the rest of today and you should NOT DRIVE or use  heavy machinery until tomorrow (because of the sedation medicines used during the test).    FOLLOW UP: Our staff will call the number listed on your records the next business day following your procedure to check on you and address any questions or concerns that you may have regarding the information given to you following your procedure. If we do not reach you, we will leave a message.  However, if you are feeling well and you are not experiencing any problems, there is no need to return our call.  We will assume that you have returned to your regular daily activities without incident.  If any biopsies were taken you will be contacted by phone or by letter within the next 1-3 weeks.  Please call us at [phone] if you have not heard about the biopsies in 3 weeks.    SIGNATURES/CONFIDENTIALITY: You and/or your care partner have signed paperwork which will be entered into your electronic medical record.  These signatures attest to the fact that that the information above on your After Visit Summary has been reviewed and is understood.  Full responsibility of the confidentiality of this discharge information lies with you and/or your care-partner.

## 2018-10-03 NOTE — Progress Notes (Signed)
To recovery, report to RN, VSS. 

## 2018-10-03 NOTE — Progress Notes (Signed)
Pt's states no medical or surgical changes since previsit or office visit. 

## 2018-10-03 NOTE — Op Note (Signed)
[place] Patient Name: Barry Miles Procedure Date: 10/03/2018 8:48 AM MRN: [id number] Endoscopist: Ladene Artist , MD Age: 62 Referring MD:  Date of Birth: [birth year] Gender: Male Account #: [id number] Procedure:                Colonoscopy Indications:              Surveillance: Personal history of adenomatous                            polyps on last colonoscopy > 5 years ago Medicines:                Monitored Anesthesia Care Procedure:                Pre-Anesthesia Assessment:                           - Prior to the procedure, a History and Physical                            was performed, and patient medications and                            allergies were reviewed. The patient's tolerance of                            previous anesthesia was also reviewed. The risks                            and benefits of the procedure and the sedation                            options and risks were discussed with the patient.                            All questions were answered, and informed consent                            was obtained. Prior Anticoagulants: The patient has                            taken no previous anticoagulant or antiplatelet                            agents. ASA Grade Assessment: II - A patient with                            mild systemic disease. After reviewing the risks                            and benefits, the patient was deemed in                            satisfactory condition to undergo the procedure.  After obtaining informed consent, the colonoscope                            was passed under direct vision. Throughout the                            procedure, the patient's blood pressure, pulse, and                            oxygen saturations were monitored continuously. The                            Model CF-HQ190L [id number]) scope was introduced                            through the anus and  advanced to the the cecum,                            identified by appendiceal orifice and ileocecal                            valve. The ileocecal valve, appendiceal orifice,                            and rectum were photographed. The quality of the                            bowel preparation was good. The colonoscopy was                            performed without difficulty. The patient tolerated                            the procedure well. Scope In: 9:24:22 AM Scope Out: 9:43:55 AM Scope Withdrawal Time: 0 hours 16 minutes 45 seconds  Total Procedure Duration: 0 hours 19 minutes 33 seconds  Findings:                 The perianal and digital rectal examinations were                            normal.                           Three sessile polyps were found in the rectum,                            sigmoid colon and descending colon. The polyps were                            6 to 8 mm in size. These polyps were removed with a                            cold snare. Resection and retrieval were complete.  Two sessile polyps were found in the transverse                            colon. The polyps were 5 mm in size. These polyps                            were removed with a cold biopsy forceps. Resection                            and retrieval were complete.                           A few small-mouthed diverticula were found in the                            left colon.                           Internal hemorrhoids were found during                            retroflexion. The hemorrhoids were small and Grade                            I (internal hemorrhoids that do not prolapse).                           The exam was otherwise without abnormality on                            direct and retroflexion views. Complications:            No immediate complications. Estimated blood loss:                            None. Estimated Blood Loss:      Estimated blood loss: none. Impression:               - Three 6 to 8 mm polyps in the rectum, in the                            sigmoid colon and in the descending colon, removed                            with a cold snare. Resected and retrieved.                           - Two 5 mm polyps in the transverse colon, removed                            with a cold biopsy forceps. Resected and retrieved.                           - Diverticulosis in the left colon.                           -  Internal hemorrhoids.                           - The examination was otherwise normal on direct                            and retroflexion views. Recommendation:           - Repeat colonoscopy in 3 - 5 years for                            surveillance pending path review.                           - Patient has a contact number available for                            emergencies. The signs and symptoms of potential                            delayed complications were discussed with the                            patient. Return to normal activities tomorrow.                            Written discharge instructions were provided to the                            patient.                           - High fiber diet.                           - Continue present medications. Ladene Artist, MD 10/03/2018 9:48:02 AM This report has been signed electronically.

## 2018-10-04 ENCOUNTER — Telehealth: Payer: Self-pay

## 2018-10-04 NOTE — Telephone Encounter (Signed)
  Follow up Call-  Call back number 10/03/2018  Post procedure Call Back phone  # [phone]  Permission to leave phone message Yes  Some recent data might be hidden     Patient questions:  Do you have a fever, pain , or abdominal swelling? No. Pain Score  0 *  Have you tolerated food without any problems? Yes.    Have you been able to return to your normal activities? Yes.    Do you have any questions about your discharge instructions: Diet   No. Medications  No. Follow up visit  No.  Do you have questions or concerns about your Care? No.  Actions: * If pain score is 4 or above: No action needed, pain <4.

## 2018-10-04 NOTE — Telephone Encounter (Signed)
Follow up call made, number identifier, left a voicemail. 

## 2018-10-18 ENCOUNTER — Encounter (HOSPITAL_COMMUNITY): Payer: Self-pay

## 2018-11-01 ENCOUNTER — Telehealth: Payer: Self-pay | Admitting: Gastroenterology

## 2018-11-01 ENCOUNTER — Encounter: Payer: Self-pay | Admitting: Gastroenterology

## 2018-12-19 NOTE — Telephone Encounter (Signed)
Done

## 2019-03-27 ENCOUNTER — Encounter: Payer: Self-pay | Admitting: Physician Assistant

## 2019-05-24 NOTE — Progress Notes (Signed)
Complete Physical  Assessment and Plan: Barry Miles was seen today for annual exam.  Diagnoses and all orders for this visit:  Encounter for annual physical exam -     CBC with Differential/Platelet -     COMPLETE METABOLIC PANEL WITH GFR   Gastroesophageal reflux disease with esophagitis Doing well at this time Contine Prilosec -     Magnesium  Toenail fungus Doing well, improved  Vitamin D deficiency Continue supplementation Vitamin D 2,000IU daily - VITAMIN D 25 Hydroxy (Vit-D Deficiency)    Carpal tunnel syndrome on both sides Doing well Will continue to monitor  Erectile dysfunction, unspecified erectile dysfunction type Doing well Rx    -tadalafil (CIALIS) 20 MG tablet; Take 1 tablet (20 mg total) by mouth daily as needed for erectile dysfunction.  Toenail fungus Doing well, reports resolved  BMI of 34.0 to 34.9 in adult Discussed dietary and exercise modifications  Medication management -     CBC with Differential/Platelet -     COMPLETE METABOLIC PANEL WITH GFR -     Magnesium -     Lipid panel -     Hemoglobin A1c -     TSH -     Insulin, random -     VITAMIN D 25 Hydroxy (Vit-D Deficiency, Fractures) -     PSA -     Microalbumin / creatinine urine ratio -     EKG 12-Lead  Screening for hyperlipidemia -     Lipid panel  Screening for hematuria or proteinuria -     Microalbumin / creatinine urine ratio  Screening for cardiovascular condition -     CBC with Differential/Platelet -     COMPLETE METABOLIC PANEL WITH GFR -     EKG 12-Lead  Screening PSA (prostate specific antigen) -     PSA   Discussed med's effects and SE's. Screening labs and tests as requested with regular follow-up as recommended.  HPI Patient presents for a complete physical.   He has a history of GERD and taking Prilosec and doing well.  He also complains of bilateral toenail fungus, worse on the left than right last visit.  Reports this has improved and almost  resolved. 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  He denies chest pain, shortness of breath, dizziness.   He is on cholesterol medication and denies myalgias. His cholesterol is at goal. The cholesterol last visit was:   Lab Results  Component Value Date   CHOL 145 03/22/2018   HDL 44 03/22/2018   LDLCALC 75 03/22/2018   TRIG 159 (H) 03/22/2018   CHOLHDL 3.3 03/22/2018   He has been working on diet and exercise for prediabetes, and denies foot ulcerations, hyperglycemia, hypoglycemia , increased appetite, nausea, paresthesia of the feet, polydipsia, polyuria, visual disturbances, vomiting and weight loss. Last A1C in the office was:  Lab Results  Component Value Date   HGBA1C 4.9 03/18/2017   Patient is on Vitamin D supplement.   Lab Results  Component Value Date   VD25OH 76 03/22/2018   Last PSA was normal and he has not BPH symptoms: Lab Results  Component Value Date   PSA 0.9 03/22/2018   BMI is Body mass index is 34.21 kg/m., he is working on diet and exercise. Wt Readings from Last 3 Encounters:  05/25/19 225 lb (102.1  kg)  10/03/18 222 lb (100.7 kg)  09/19/18 226 lb (102.5 kg)    Current Medications:  Current Outpatient Medications on File Prior to Visit  Medication Sig Dispense Refill  . CALCIUM CARBONATE PO Take 1,000 mg by mouth as needed.    . Cholecalciferol (VITAMIN D3) 2000 UNITS TABS Take 3 capsules by mouth 3 (three) times daily.    Marland Kitchen ibuprofen (ADVIL,MOTRIN) 200 MG tablet Take 200 mg by mouth every 6 (six) hours as needed.    . Magnesium 400 MG CAPS Take 400 mg by mouth daily.    . meloxicam (MOBIC) 15 MG tablet 1 TAB DAILY WITH FOOD X 2 WEEKS THEN AS NEEDED DAILY FOR PAIN.NO ALEVE/IBUPROFEN, CAN TAKE TYLENOL 30 tablet 1  . Multiple Vitamins-Minerals (MULTIVITAMIN WITH MINERALS) tablet Take 1 tablet by  mouth daily.    Marland Kitchen omeprazole (PRILOSEC) 40 MG capsule Take 1 capsule (40 mg total) by mouth daily. 14 capsule 0  . vitamin C (ASCORBIC ACID) 500 MG tablet Take 500 mg by mouth daily.     No current facility-administered medications on file prior to visit.     Health Maintenance:  Immunization History  Administered Date(s) Administered  . Influenza-Unspecified 09/06/2013  . PPD Test 03/02/2014  . Td 08/27/2005  . Tdap 02/01/2013   Tetanus: 2014 Flu vaccine: 2018, at work Colonoscopy: 2019, Dr. Fuller Plan, 3-5years Eye Exam:  Dr. Peter Garter , Glasses, goes yearly, due for 2020 Dentist: Dr. Reola Calkins   6/20200  Patient Care Team: Unk Pinto, MD as PCP - General (Internal Medicine) Rutherford Guys, MD as Consulting Physician (Ophthalmology) Fay Records, MD as Consulting Physician (Cardiology) Ladene Artist, MD as Consulting Physician (Gastroenterology) Franchot Gallo, MD as Consulting Physician (Urology) Roseanne Kaufman, MD as Consulting Physician (Orthopedic Surgery)  Medical History:  Past Medical History:  Diagnosis Date  . Arthritis   . Arthritis   . Carpal tunnel syndrome on both sides   . Erectile dysfunction   . GERD (gastroesophageal reflux disease)   . Vertigo    Allergies No Known Allergies  SURGICAL HISTORY He  has a past surgical history that includes Tonsillectomy; Colonoscopy; Polypectomy; and Wisdom tooth extraction. FAMILY HISTORY His family history includes Breast cancer in his mother; Colon polyps in his maternal aunt and maternal uncle; Diabetes in his father, maternal grandmother, mother, and paternal grandmother; Heart disease in his father. SOCIAL HISTORY He  reports that he has never smoked. He has never used smokeless tobacco. He reports that he does not drink alcohol or use drugs.   Review of Systems:  Review of Systems  Constitutional: Negative for chills, fever and malaise/fatigue.  HENT: Negative for congestion, ear pain and sore throat.    Eyes: Negative.   Respiratory: Negative for cough, shortness of breath and wheezing.   Cardiovascular: Negative for chest pain, palpitations and leg swelling.  Gastrointestinal: Positive for heartburn. Negative for abdominal pain, blood in stool, constipation, diarrhea and melena.  Genitourinary: Negative.   Musculoskeletal: Positive for back pain and joint pain.  Neurological: Negative for dizziness, sensory change, loss of consciousness and headaches.  Psychiatric/Behavioral: Negative for depression. The patient is not nervous/anxious and does not have insomnia.     Physical Exam: Estimated body mass index is 34.21 kg/m as calculated from the following:   Height as of this encounter: 5\' 8"  (1.727 m).   Weight as of this encounter: 225 lb (102.1 kg). BP 126/84   Pulse 79   Temp (!) 97.5 F (36.4 C)  Ht 5\' 8"  (1.727 m)   Wt 225 lb (102.1 kg)   SpO2 97%   BMI 34.21 kg/m   General Appearance: Well nourished, in no apparent distress.  Eyes: PERRLA, EOMs, conjunctiva no swelling or erythema ENT/Mouth: Ear canals clear bilaterally with no erythema, swelling, discharge.  TMs normal bilaterally with no erythema, bulging, or retractions.  Oropharynx clear and moist with no exudate, swelling, or erythema.  Dentition normal.   Neck: Supple, thyroid normal. No bruits, JVD, cervical adenopathy Respiratory: Respiratory effort normal, BS equal bilaterally without rales, rhonchi, wheezing or stridor.  Cardio: RRR without murmurs, rubs or gallops. Brisk peripheral pulses without edema.  Chest: symmetric, with normal excursions Abdomen: Soft, nontender, no guarding, rebound, hernias, masses, or organomegaly. Genitourinary: Patient preference to defer to Dr. Melford Aase or Dr. Payton Mccallum for prostate exam Musculoskeletal: Full ROM all peripheral extremities,5/5 strength, and normal gait.  Skin: Warm, dry without rashes, lesions, ecchymosis. 4-5 scattered sebborrheic keratoses located on the  back Neuro: A&Ox3, Cranial nerves intact, reflexes equal bilaterally. Normal muscle tone, no cerebellar symptoms. Sensation intact.  Psych: Normal affect, Insight and Judgment appropriate.   EKG: Sinus rhythm with no changes from prior ekg,Prolonged QT  AORTA SCAN: defer  Over 40 minutes of exam, counseling, chart review and critical decision making was performed  Seerat Peaden 08:45 AM [facility]

## 2019-05-25 ENCOUNTER — Encounter: Payer: Self-pay | Admitting: Adult Health Nurse Practitioner

## 2019-05-25 ENCOUNTER — Other Ambulatory Visit: Payer: Self-pay

## 2019-05-25 ENCOUNTER — Ambulatory Visit (INDEPENDENT_AMBULATORY_CARE_PROVIDER_SITE_OTHER): Payer: 59 | Admitting: Adult Health Nurse Practitioner

## 2019-05-25 VITALS — BP 126/84 | HR 79 | Temp 97.5°F | Ht 68.0 in | Wt 225.0 lb

## 2019-05-25 DIAGNOSIS — E559 Vitamin D deficiency, unspecified: Secondary | ICD-10-CM | POA: Diagnosis not present

## 2019-05-25 DIAGNOSIS — Z0000 Encounter for general adult medical examination without abnormal findings: Secondary | ICD-10-CM

## 2019-05-25 DIAGNOSIS — Z6834 Body mass index (BMI) 34.0-34.9, adult: Secondary | ICD-10-CM

## 2019-05-25 DIAGNOSIS — K2100 Gastro-esophageal reflux disease with esophagitis, without bleeding: Secondary | ICD-10-CM

## 2019-05-25 DIAGNOSIS — Z131 Encounter for screening for diabetes mellitus: Secondary | ICD-10-CM

## 2019-05-25 DIAGNOSIS — Z79899 Other long term (current) drug therapy: Secondary | ICD-10-CM | POA: Diagnosis not present

## 2019-05-25 DIAGNOSIS — B351 Tinea unguium: Secondary | ICD-10-CM

## 2019-05-25 DIAGNOSIS — R350 Frequency of micturition: Secondary | ICD-10-CM

## 2019-05-25 DIAGNOSIS — Z136 Encounter for screening for cardiovascular disorders: Secondary | ICD-10-CM

## 2019-05-25 DIAGNOSIS — Z125 Encounter for screening for malignant neoplasm of prostate: Secondary | ICD-10-CM

## 2019-05-25 DIAGNOSIS — Z13220 Encounter for screening for lipoid disorders: Secondary | ICD-10-CM | POA: Diagnosis not present

## 2019-05-25 DIAGNOSIS — N529 Male erectile dysfunction, unspecified: Secondary | ICD-10-CM

## 2019-05-25 DIAGNOSIS — G5603 Carpal tunnel syndrome, bilateral upper limbs: Secondary | ICD-10-CM

## 2019-05-25 DIAGNOSIS — Z1389 Encounter for screening for other disorder: Secondary | ICD-10-CM

## 2019-05-25 DIAGNOSIS — N401 Enlarged prostate with lower urinary tract symptoms: Secondary | ICD-10-CM | POA: Diagnosis not present

## 2019-05-25 MED ORDER — TADALAFIL 20 MG PO TABS: 20.0000 mg | ORAL_TABLET | Freq: Every day | ORAL | 2 refills | Status: DC | PRN

## 2019-05-25 NOTE — Patient Instructions (Addendum)
We will contact you in 1-3 days with your lab results.  -Try Zyrtec when you have symptoms and then take it every night for a week.  You do need need the D or decongestant unless your have nasal congestion.  Our goal is to dry up any fluid that may be behind your ears increasing dizziness.  Also you may to to switch antihistamines to find one that dries you out, Xyzal and Allegra are two other examples to try.  -It is important to starting a walking regiment.  This will help your physical and mental health.  Start with short distances and increase slowly.   Benign Paroxysmal Positional Vertigo (BPPV)  General Information In Benign Paroxysmal Positional Vertigo (BPPV) dizziness is generally thought to be due to debris which has collected within a part of the inner ear. This debris can be thought of as "ear rocks", although the formal name is "otoconia". Ear rocks are small crystals of calcium carbonate derived from a structure in the ear called the "utricle" (figure to the right ). The symptoms of BPPV include dizziness or vertigo, lightheadedness, imbalance, and nausea. Activities which bring on symptoms will vary among persons, but symptoms are usually followed by a change of position of the head like getting out of bed or rolling over in bed are common "problem" motions .  However if you have worsening HA, changes vision/speech, weakness go to the ER     What can be done? 1) Use two or more pillows at night. Avoid sleeping on the "bad" side. In the morning, get up slowly and sit on the edge of the bed for a minute. 2) Medication prescribed by your doctor.  3) The exercises below, you can do at home to help you prevent the sensation later in the day.  4) We can refer you to physical therapy at [facility], # [place] treatments: The Brandt-Daroff Exercises are a home method of treating BPPV,and are effective 95% of the time.  These exercises are performed in three  sets per day for two weeks. In each set, one performs the maneuver as shown five times. Start sitting upright (position 1). Then move into the side-lying position (position 2), with the head angled upward about halfway. An easy way to remember this is to imagine someone standing about 6 feet in front of you, and just keep looking at their head at all times. Stay in the side-lying position for 30 seconds, or until the dizziness subsides if this is longer, then go back to the sitting position (position 3). Stay there for 30 seconds, and then go to the opposite side (position 4) and follow the same routine.  At home Epley Maneuver This procedure seems to be even more effective than the in-office procedure, perhaps because it is repeated every night for a week.  The method (for the left side) is performed as shown on the figure below.  1) One stays in each of the supine (lying down) positions for 30 seconds, and in the sitting upright position (top) for 1 minute.  2) Thus, once cycle takes 2 1/2 minutes.  3) Typically 3 cycles are performed just prior to going to sleep.  4) It is best to do them at night rather than in the morning or midday, as if one becomes dizzy following the exercises, then it can resolve while one is sleeping.  The mirror image of this procedure is used for the right ear.  -  Vit D  And Vit C 1,000 mg  are recommended to help protect  against the Covid_19 and other Corona viruses.   - Also it's recommended to take Zinc 50 mg to help  protect against the Covid_19  And best place to get  is also on [employer].[email or web address] and don't pay more than 6-8 cents /pill !   =============================== Coronavirus (COVID-19) Are you at risk?  Are you at risk for the Coronavirus (COVID-19)?  To be considered HIGH RISK for Coronavirus (COVID-19), you have to meet the following criteria:  . Traveled to [place], [place], [place], [place] or [place]; or in the [place] to [place], [place], [place]  . or [place]; and have fever, cough, and shortness of breath within the last 2 weeks of travel OR . Been in close contact with a person diagnosed with COVID-19 within the last 2 weeks and have  . fever, cough,and shortness of breath .  . IF YOU DO NOT MEET THESE CRITERIA, YOU ARE CONSIDERED LOW RISK FOR COVID-19.  What to do if you are HIGH RISK for COVID-19?  Marland Kitchen If you are having a medical emergency, call 911. . Seek medical care right away. Before you go to a doctor's office, urgent care or emergency department, .  call ahead and tell them about your recent travel, contact with someone diagnosed with COVID-19  .  and your symptoms.  . You should receive instructions from your physician's office regarding next steps of care.  . When you arrive at healthcare provider, tell the healthcare staff immediately you have returned from  . visiting [place], [place], [place], [place] or [place]; or traveled in the [place] to [place], [place],  . [place] or [place] in the last two weeks or you have been in close contact with a person diagnosed with  . COVID-19 in the last 2 weeks.   . Tell the health care staff about your symptoms: fever, cough and shortness of breath. . After you have been seen by a medical provider, you will be either: o Tested for (COVID-19) and discharged home on quarantine except to seek medical care if  o symptoms worsen, and asked to  - Stay home and avoid contact with others until you get your results (4-5 days)  - Avoid travel on public transportation if possible (such as bus, train, or airplane) or o Sent to the Emergency Department by EMS for evaluation, COVID-19 testing  and  o possible admission depending on your condition and test results.  What to do if you are LOW RISK for COVID-19?  Reduce your risk of any infection by using the same precautions used for avoiding the common cold or flu:  Marland Kitchen Wash your hands often with soap and warm water for at  least 20 seconds.  If soap and water are not readily available,  . use an alcohol-based hand sanitizer with at least 60% alcohol.  . If coughing or sneezing, cover your mouth and nose by coughing or sneezing into the elbow areas of your shirt or coat, .  into a tissue or into your sleeve (not your hands). . Avoid shaking hands with others and consider head nods or verbal greetings only. . Avoid touching your eyes, nose, or mouth with unwashed hands.  . Avoid close contact with people who are sick. . Avoid places or events with large numbers of people in one location, like concerts or sporting events. . Carefully consider travel plans  you have or are making. . If you are planning any travel outside or inside the [place], visit the CDC's Travelers' Health webpage for the latest health notices. . If you have some symptoms but not all symptoms, continue to monitor at home and seek medical attention  . if your symptoms worsen. . If you are having a medical emergency, call 911. >>>>>>>>>>>>>>>>>>>>>>>>>>>> Preventive Care for Adults  A healthy lifestyle and preventive care can promote health and wellness. Preventive health guidelines for men include the following key practices:  A routine yearly physical is a good way to check with your health care provider about your health and preventative screening. It is a chance to share any concerns and updates on your health and to receive a thorough exam.  Visit your dentist for a routine exam and preventative care every 6 months. Brush your teeth twice a day and floss once a day. Good oral hygiene prevents tooth decay and gum disease.  The frequency of eye exams is based on your age, health, family medical history, use of contact lenses, and other factors. Follow your health care provider's recommendations for frequency of eye exams.  Eat a healthy diet. Foods such as vegetables, fruits, whole grains, low-fat dairy products, and lean protein foods contain the  nutrients you need without too many calories. Decrease your intake of foods high in solid fats, added sugars, and salt. Eat the right amount of calories for you. Get information about a proper diet from your health care provider, if necessary.  Regular physical exercise is one of the most important things you can do for your health. Most adults should get at least 150 minutes of moderate-intensity exercise (any activity that increases your heart rate and causes you to sweat) each week. In addition, most adults need muscle-strengthening exercises on 2 or more days a week.  Maintain a healthy weight. The body mass index (BMI) is a screening tool to identify possible weight problems. It provides an estimate of body fat based on height and weight. Your health care provider can find your BMI and can help you achieve or maintain a healthy weight. For adults 20 years and older:  A BMI below 18.5 is considered underweight.  A BMI of 18.5 to 24.9 is normal.  A BMI of 25 to 29.9 is considered overweight.  A BMI of 30 and above is considered obese.  Maintain normal blood lipids and cholesterol levels by exercising and minimizing your intake of saturated fat. Eat a balanced diet with plenty of fruit and vegetables. Blood tests for lipids and cholesterol should begin at age 52 and be repeated every 5 years. If your lipid or cholesterol levels are high, you are over 50, or you are at high risk for heart disease, you may need your cholesterol levels checked more frequently. Ongoing high lipid and cholesterol levels should be treated with medicines if diet and exercise are not working.  If you smoke, find out from your health care provider how to quit. If you do not use tobacco, do not start.  Lung cancer screening is recommended for adults aged 61-80 years who are at high risk for developing lung cancer because of a history of smoking. A yearly low-dose CT scan of the lungs is recommended for people who have at  least a 30-pack-year history of smoking and are a current smoker or have quit within the past 15 years. A pack year of smoking is smoking an average of 1 pack of cigarettes a  day for 1 year (for example: 1 pack a day for 30 years or 2 packs a day for 15 years). Yearly screening should continue until the smoker has stopped smoking for at least 15 years. Yearly screening should be stopped for people who develop a health problem that would prevent them from having lung cancer treatment.  If you choose to drink alcohol, do not have more than 2 drinks per day. One drink is considered to be 12 ounces (355 mL) of beer, 5 ounces (148 mL) of wine, or 1.5 ounces (44 mL) of liquor.  Avoid use of street drugs. Do not share needles with anyone. Ask for help if you need support or instructions about stopping the use of drugs.  High blood pressure causes heart disease and increases the risk of stroke. Your blood pressure should be checked at least every 1-2 years. Ongoing high blood pressure should be treated with medicines, if weight loss and exercise are not effective.  If you are 90-62 years old, ask your health care provider if you should take aspirin to prevent heart disease.  Diabetes screening involves taking a blood sample to check your fasting blood sugar level. This should be done once every 3 years, after age 46, if you are within normal weight and without risk factors for diabetes. Testing should be considered at a younger age or be carried out more frequently if you are overweight and have at least 1 risk factor for diabetes.  Colorectal cancer can be detected and often prevented. Most routine colorectal cancer screening begins at the age of 32 and continues through age 78. However, your health care provider may recommend screening at an earlier age if you have risk factors for colon cancer. On a yearly basis, your health care provider may provide home test kits to check for hidden blood in the stool. Use  of a small camera at the end of a tube to directly examine the colon (sigmoidoscopy or colonoscopy) can detect the earliest forms of colorectal cancer. Talk to your health care provider about this at age 17, when routine screening begins. Direct exam of the colon should be repeated every 5-10 years through age 71, unless early forms of precancerous polyps or small growths are found.   Talk with your health care provider about prostate cancer screening.  Testicular cancer screening isrecommended for adult males. Screening includes self-exam, a health care provider exam, and other screening tests. Consult with your health care provider about any symptoms you have or any concerns you have about testicular cancer.  Use sunscreen. Apply sunscreen liberally and repeatedly throughout the day. You should seek shade when your shadow is shorter than you. Protect yourself by wearing long sleeves, pants, a wide-brimmed hat, and sunglasses year round, whenever you are outdoors.  Once a month, do a whole-body skin exam, using a mirror to look at the skin on your back. Tell your health care provider about new moles, moles that have irregular borders, moles that are larger than a pencil eraser, or moles that have changed in shape or color.  Stay current with required vaccines (immunizations).  Influenza vaccine. All adults should be immunized every year.  Tetanus, diphtheria, and acellular pertussis (Td, Tdap) vaccine. An adult who has not previously received Tdap or who does not know his vaccine status should receive 1 dose of Tdap. This initial dose should be followed by tetanus and diphtheria toxoids (Td) booster doses every 10 years. Adults with an unknown or incomplete history of  completing a 3-dose immunization series with Td-containing vaccines should begin or complete a primary immunization series including a Tdap dose. Adults should receive a Td booster every 10 years.  Varicella vaccine. An adult without  evidence of immunity to varicella should receive 2 doses or a second dose if he has previously received 1 dose.  Human papillomavirus (HPV) vaccine. Males aged 18-21 years who have not received the vaccine previously should receive the 3-dose series. Males aged 22-26 years may be immunized. Immunization is recommended through the age of 65 years for any male who has sex with males and did not get any or all doses earlier. Immunization is recommended for any person with an immunocompromised condition through the age of 46 years if he did not get any or all doses earlier. During the 3-dose series, the second dose should be obtained 4-8 weeks after the first dose. The third dose should be obtained 24 weeks after the first dose and 16 weeks after the second dose.  Zoster vaccine. One dose is recommended for adults aged 20 years or older unless certain conditions are present.    PREVNAR  - Pneumococcal 13-valent conjugate (PCV13) vaccine. When indicated, a person who is uncertain of his immunization history and has no record of immunization should receive the PCV13 vaccine. An adult aged 40 years or older who has certain medical conditions and has not been previously immunized should receive 1 dose of PCV13 vaccine. This PCV13 should be followed with a dose of pneumococcal polysaccharide (PPSV23) vaccine. The PPSV23 vaccine dose should be obtained at least 1 r more year(s) after the dose of PCV13 vaccine. An adult aged 86 years or older who has certain medical conditions and previously received 1 or more doses of PPSV23 vaccine should receive 1 dose of PCV13. The PCV13 vaccine dose should be obtained 1 or more years after the last PPSV23 vaccine dose.    PNEUMOVAX - Pneumococcal polysaccharide (PPSV23) vaccine. When PCV13 is also indicated, PCV13 should be obtained first. All adults aged 50 years and older should be immunized. An adult younger than age 57 years who has certain medical conditions should be  immunized. Any person who resides in a nursing home or long-term care facility should be immunized. An adult smoker should be immunized. People with an immunocompromised condition and certain other conditions should receive both PCV13 and PPSV23 vaccines. People with human immunodeficiency virus (HIV) infection should be immunized as soon as possible after diagnosis. Immunization during chemotherapy or radiation therapy should be avoided. Routine use of PPSV23 vaccine is not recommended for American Indians, [place] Natives, or people younger than 65 years unless there are medical conditions that require PPSV23 vaccine. When indicated, people who have unknown immunization and have no record of immunization should receive PPSV23 vaccine. One-time revaccination 5 years after the first dose of PPSV23 is recommended for people aged 19-64 years who have chronic kidney failure, nephrotic syndrome, asplenia, or immunocompromised conditions. People who received 1-2 doses of PPSV23 before age 28 years should receive another dose of PPSV23 vaccine at age 53 years or later if at least 5 years have passed since the previous dose. Doses of PPSV23 are not needed for people immunized with PPSV23 at or after age 6 years.    Hepatitis A vaccine. Adults who wish to be protected from this disease, have certain high-risk conditions, work with hepatitis A-infected animals, work in hepatitis A research labs, or travel to or work in countries with a high rate of hepatitis  A should be immunized. Adults who were previously unvaccinated and who anticipate close contact with an international adoptee during the first 60 days after arrival in the [place] from a country with a high rate of hepatitis A should be immunized.    Hepatitis B vaccine. Adults should be immunized if they wish to be protected from this disease, have certain high-risk conditions, may be exposed to blood or other infectious body fluids, are household contacts  or sex partners of hepatitis B positive people, are clients or workers in certain care facilities, or travel to or work in countries with a high rate of hepatitis B.   Preventive Service / Frequency   Ages 33 to 36  Blood pressure check.  Lipid and cholesterol check  Lung cancer screening. / Every year if you are aged 29-80 years and have a 30-pack-year history of smoking and currently smoke or have quit within the past 15 years. Yearly screening is stopped once you have quit smoking for at least 15 years or develop a health problem that would prevent you from having lung cancer treatment.  Fecal occult blood test (FOBT) of stool. / Every year beginning at age 65 and continuing until age 101. You may not have to do this test if you get a colonoscopy every 10 years.  Flexible sigmoidoscopy** or colonoscopy.** / Every 5 years for a flexible sigmoidoscopy or every 10 years for a colonoscopy beginning at age 19 and continuing until age 63. Screening for abdominal aortic aneurysm (AAA)  by ultrasound is recommended for people who have history of high blood pressure or who are current or former smokers. +++++++++++ Recommend Adult Low Dose Aspirin or  coated  Aspirin 81 mg daily  To reduce risk of Colon Cancer 40 %,  Skin Cancer 26 % ,  Malignant Melanoma 46%  and  Pancreatic cancer 60% ++++++++++++++++++++ Vitamin D goal  is between 70-100.  Please make sure that you are taking your Vitamin D as directed.  It is very important as a natural anti-inflammatory  helping hair, skin, and nails, as well as reducing stroke and heart attack risk.  It helps your bones and helps with mood. It also decreases numerous cancer risks so please take it as directed.  Low Vit D is associated with a 200-300% higher risk for CANCER  and 200-300% higher risk for HEART   ATTACK  &  STROKE.   .....................................Marland Kitchen It is also associated with higher death rate at younger ages,  autoimmune  diseases like Rheumatoid arthritis, Lupus, Multiple Sclerosis.    Also many other serious conditions, like depression, Alzheimer's Dementia, infertility, muscle aches, fatigue, fibromyalgia - just to name a few. +++++++++++++++++++++ Recommend the book "The END of DIETING" by Dr Excell Seltzer  & the book "The END of DIABETES " by Dr Excell Seltzer At [email or web address] - get book & Audio CD's    Being diabetic has a  300% increased risk for heart attack, stroke, cancer, and alzheimer- type vascular dementia. It is very important that you work harder with diet by avoiding all foods that are white. Avoid white rice (brown & wild rice is OK), white potatoes (sweetpotatoes in moderation is OK), White bread or wheat bread or anything made out of white flour like bagels, donuts, rolls, buns, biscuits, cakes, pastries, cookies, pizza crust, and pasta (made from white flour & egg whites) - vegetarian pasta or spinach or wheat pasta is OK. Multigrain breads like Arnold's or [employer], or multigrain sandwich thins  or flatbreads.  Diet, exercise and weight loss can reverse and cure diabetes in the early stages.  Diet, exercise and weight loss is very important in the control and prevention of complications of diabetes which affects every system in your body, ie. Brain - dementia/stroke, eyes - glaucoma/blindness, heart - heart attack/heart failure, kidneys - dialysis, stomach - gastric paralysis, intestines - malabsorption, nerves - severe painful neuritis, circulation - gangrene & loss of a leg(s), and finally cancer and Alzheimers.    I recommend avoid fried & greasy foods,  sweets/candy, white rice (brown or wild rice or Quinoa is OK), white potatoes (sweet potatoes are OK) - anything made from white flour - bagels, doughnuts, rolls, buns, biscuits,white and wheat breads, pizza crust and traditional pasta made of white flour & egg white(vegetarian pasta or spinach or wheat pasta is OK).  Multi-grain bread is OK - like  multi-grain flat bread or sandwich thins. Avoid alcohol in excess. Exercise is also important.    Eat all the vegetables you want - avoid meat, especially red meat and dairy - especially cheese.  Cheese is the most concentrated form of trans-fats which is the worst thing to clog up our arteries. Veggie cheese is OK which can be found in the fresh produce section at [employer] or [employer] or [employer]  ++++++++++++++++++++++ DASH Eating Plan  DASH stands for "Dietary Approaches to Stop Hypertension."   The DASH eating plan is a healthy eating plan that has been shown to reduce high blood pressure (hypertension). Additional health benefits may include reducing the risk of type 2 diabetes mellitus, heart disease, and stroke. The DASH eating plan may also help with weight loss. WHAT DO I NEED TO KNOW ABOUT THE DASH EATING PLAN? For the DASH eating plan, you will follow these general guidelines:  Choose foods with a percent daily value for sodium of less than 5% (as listed on the food label).  Use salt-free seasonings or herbs instead of table salt or sea salt.  Check with your health care provider or pharmacist before using salt substitutes.  Eat lower-sodium products, often labeled as "lower sodium" or "no salt added."  Eat fresh foods.  Eat more vegetables, fruits, and low-fat dairy products.  Choose whole grains. Look for the word "whole" as the first word in the ingredient list.  Choose fish   Limit sweets, desserts, sugars, and sugary drinks.  Choose heart-healthy fats.  Eat veggie cheese   Eat more home-cooked food and less restaurant, buffet, and fast food.  Limit fried foods.  Cook foods using methods other than frying.  Limit canned vegetables. If you do use them, rinse them well to decrease the sodium.  When eating at a restaurant, ask that your food be prepared with less salt, or no salt if possible.                      WHAT FOODS CAN I EAT? Read Dr Fara Olden  Fuhrman's books on The End of Dieting & The End of Diabetes  Grains Whole grain or whole wheat bread. Brown rice. Whole grain or whole wheat pasta. Quinoa, bulgur, and whole grain cereals. Low-sodium cereals. Corn or whole wheat flour tortillas. Whole grain cornbread. Whole grain crackers. Low-sodium crackers.  Vegetables Fresh or frozen vegetables (raw, steamed, roasted, or grilled). Low-sodium or reduced-sodium tomato and vegetable juices. Low-sodium or reduced-sodium tomato sauce and paste. Low-sodium or reduced-sodium canned vegetables.   Fruits All fresh, canned (in natural juice), or  frozen fruits.  Protein Products  All fish and seafood.  Dried beans, peas, or lentils. Unsalted nuts and seeds. Unsalted canned beans.  Dairy Low-fat dairy products, such as skim or 1% milk, 2% or reduced-fat cheeses, low-fat ricotta or cottage cheese, or plain low-fat yogurt. Low-sodium or reduced-sodium cheeses.  Fats and Oils Tub margarines without trans fats. Light or reduced-fat mayonnaise and salad dressings (reduced sodium). Avocado. Safflower, olive, or canola oils. Natural peanut or almond butter.  Other Unsalted popcorn and pretzels. The items listed above may not be a complete list of recommended foods or beverages. Contact your dietitian for more options.  +++++++++++++++++++  WHAT FOODS ARE NOT RECOMMENDED? Grains/ White flour or wheat flour White bread. White pasta. White rice. Refined cornbread. Bagels and croissants. Crackers that contain trans fat.  Vegetables  Creamed or fried vegetables. Vegetables in a . Regular canned vegetables. Regular canned tomato sauce and paste. Regular tomato and vegetable juices.  Fruits Dried fruits. Canned fruit in light or heavy syrup. Fruit juice.  Meat and Other Protein Products Meat in general - RED meat & White meat.  Fatty cuts of meat. Ribs, chicken wings, all processed meats as bacon, sausage, bologna, salami, fatback, hot dogs,  bratwurst and packaged luncheon meats.  Dairy Whole or 2% milk, cream, half-and-half, and cream cheese. Whole-fat or sweetened yogurt. Full-fat cheeses or blue cheese. Non-dairy creamers and whipped toppings. Processed cheese, cheese spreads, or cheese curds.  Condiments Onion and garlic salt, seasoned salt, table salt, and sea salt. Canned and packaged gravies. Worcestershire sauce. Tartar sauce. Barbecue sauce. Teriyaki sauce. Soy sauce, including reduced sodium. Steak sauce. Fish sauce. Oyster sauce. Cocktail sauce. Horseradish. Ketchup and mustard. Meat flavorings and tenderizers. Bouillon cubes. Hot sauce. Tabasco sauce. Marinades. Taco seasonings. Relishes.  Fats and Oils Butter, stick margarine, lard, shortening and bacon fat. Coconut, palm kernel, or palm oils. Regular salad dressings.  Pickles and olives. Salted popcorn and pretzels.  The items listed above may not be a complete list of foods and beverages to avoid.

## 2019-05-26 LAB — COMPLETE METABOLIC PANEL WITH GFR
AG Ratio: 1.9 (calc) (ref 1.0–2.5)
ALT: 27 U/L (ref 9–46)
AST: 22 U/L (ref 10–35)
Albumin: 4.5 g/dL (ref 3.6–5.1)
Alkaline phosphatase (APISO): 73 U/L (ref 35–144)
BUN: 17 mg/dL (ref 7–25)
CO2: 25 mmol/L (ref 20–32)
Calcium: 9.3 mg/dL (ref 8.6–10.3)
Chloride: 107 mmol/L (ref 98–110)
Creat: 1.2 mg/dL (ref 0.70–1.25)
GFR, Est African American: 76 mL/min/{1.73_m2} (ref >=60)
GFR, Est Non African American: 65 mL/min/{1.73_m2} (ref >=60)
Globulin: 2.4 g/dL (calc) (ref 1.9–3.7)
Glucose, Bld: 87 mg/dL (ref 65–99)
Potassium: 3.9 mmol/L (ref 3.5–5.3)
Sodium: 140 mmol/L (ref 135–146)
Total Bilirubin: 0.6 mg/dL (ref 0.2–1.2)
Total Protein: 6.9 g/dL (ref 6.1–8.1)

## 2019-05-26 LAB — LIPID PANEL
Cholesterol: 175 mg/dL (ref <200)
HDL: 48 mg/dL (ref >=40)
LDL Cholesterol (Calc): 105 mg/dL (calc) — ABNORMAL HIGH
Non-HDL Cholesterol (Calc): 127 mg/dL (calc) (ref <130)
Total CHOL/HDL Ratio: 3.6 (calc) (ref <5.0)
Triglycerides: 121 mg/dL (ref <150)

## 2019-05-26 LAB — CBC WITH DIFFERENTIAL/PLATELET
Absolute Monocytes: 290 cells/uL (ref 200–950)
Basophils Absolute: 32 cells/uL (ref 0–200)
Basophils Relative: 0.7 %
Eosinophils Absolute: 92 cells/uL (ref 15–500)
Eosinophils Relative: 2 %
HCT: 45.4 % (ref 38.5–50.0)
Hemoglobin: 15.7 g/dL (ref 13.2–17.1)
Lymphs Abs: 1150 cells/uL (ref 850–3900)
MCH: 31 pg (ref 27.0–33.0)
MCHC: 34.6 g/dL (ref 32.0–36.0)
MCV: 89.5 fL (ref 80.0–100.0)
MPV: 9.7 fL (ref 7.5–12.5)
Monocytes Relative: 6.3 %
Neutro Abs: 3036 cells/uL (ref 1500–7800)
Neutrophils Relative %: 66 %
Platelets: 169 10*3/uL (ref 140–400)
RBC: 5.07 10*6/uL (ref 4.20–5.80)
RDW: 13 % (ref 11.0–15.0)
Total Lymphocyte: 25 %
WBC: 4.6 10*3/uL (ref 3.8–10.8)

## 2019-05-26 LAB — MICROALBUMIN / CREATININE URINE RATIO
Creatinine, Urine: 105 mg/dL (ref 20–320)
Microalb Creat Ratio: 4 mcg/mg creat (ref <30)
Microalb, Ur: 0.4 mg/dL

## 2019-05-26 LAB — PSA: PSA: 1 ng/mL (ref <=4.0)

## 2019-05-26 LAB — MAGNESIUM: Magnesium: 2 mg/dL (ref 1.5–2.5)

## 2019-05-26 LAB — TSH: TSH: 2.12 mIU/L (ref 0.40–4.50)

## 2019-05-26 LAB — HEMOGLOBIN A1C
Hgb A1c MFr Bld: 5.4 % of total Hgb (ref <5.7)
Mean Plasma Glucose: 108 (calc)
eAG (mmol/L): 6 (calc)

## 2019-05-26 LAB — INSULIN, RANDOM: Insulin: 2.5 u[IU]/mL

## 2019-05-26 LAB — VITAMIN D 25 HYDROXY (VIT D DEFICIENCY, FRACTURES): Vit D, 25-Hydroxy: 75 ng/mL (ref 30–100)

## 2019-08-08 DIAGNOSIS — R569 Unspecified convulsions: Secondary | ICD-10-CM

## 2019-08-08 HISTORY — DX: Unspecified convulsions: R56.9

## 2019-09-03 ENCOUNTER — Encounter (HOSPITAL_COMMUNITY): Payer: Self-pay | Admitting: Emergency Medicine

## 2019-09-03 ENCOUNTER — Emergency Department (HOSPITAL_COMMUNITY): Payer: 59

## 2019-09-03 ENCOUNTER — Observation Stay (HOSPITAL_COMMUNITY)
Admission: EM | Admit: 2019-09-03 | Discharge: 2019-09-04 | Disposition: A | Discharge status: Home / Self Care | Payer: 59 | Attending: Internal Medicine | Admitting: Internal Medicine

## 2019-09-03 DIAGNOSIS — R42 Dizziness and giddiness: Secondary | ICD-10-CM

## 2019-09-03 DIAGNOSIS — G5603 Carpal tunnel syndrome, bilateral upper limbs: Secondary | ICD-10-CM | POA: Diagnosis not present

## 2019-09-03 DIAGNOSIS — I10 Essential (primary) hypertension: Secondary | ICD-10-CM | POA: Diagnosis not present

## 2019-09-03 DIAGNOSIS — Z20828 Contact with and (suspected) exposure to other viral communicable diseases: Secondary | ICD-10-CM | POA: Diagnosis not present

## 2019-09-03 DIAGNOSIS — Z79899 Other long term (current) drug therapy: Secondary | ICD-10-CM | POA: Insufficient documentation

## 2019-09-03 DIAGNOSIS — N179 Acute kidney failure, unspecified: Secondary | ICD-10-CM | POA: Diagnosis not present

## 2019-09-03 DIAGNOSIS — M1990 Unspecified osteoarthritis, unspecified site: Secondary | ICD-10-CM | POA: Insufficient documentation

## 2019-09-03 DIAGNOSIS — R569 Unspecified convulsions: Secondary | ICD-10-CM | POA: Diagnosis not present

## 2019-09-03 DIAGNOSIS — R000 Tachycardia, unspecified: Secondary | ICD-10-CM | POA: Diagnosis not present

## 2019-09-03 DIAGNOSIS — K219 Gastro-esophageal reflux disease without esophagitis: Secondary | ICD-10-CM | POA: Diagnosis present

## 2019-09-03 DIAGNOSIS — R778 Other specified abnormalities of plasma proteins: Secondary | ICD-10-CM | POA: Diagnosis present

## 2019-09-03 DIAGNOSIS — R55 Syncope and collapse: Principal | ICD-10-CM | POA: Diagnosis present

## 2019-09-03 DIAGNOSIS — R7989 Other specified abnormal findings of blood chemistry: Secondary | ICD-10-CM | POA: Diagnosis present

## 2019-09-03 HISTORY — DX: Unspecified convulsions: R56.9

## 2019-09-03 LAB — CBC WITH DIFFERENTIAL/PLATELET
Abs Immature Granulocytes: 0.03 10*3/uL (ref 0.00–0.07)
Basophils Absolute: 0 10*3/uL (ref 0.0–0.1)
Basophils Relative: 0 %
Eosinophils Absolute: 0.2 10*3/uL (ref 0.0–0.5)
Eosinophils Relative: 3 %
HCT: 46 % (ref 39.0–52.0)
Hemoglobin: 15.5 g/dL (ref 13.0–17.0)
Immature Granulocytes: 0 %
Lymphocytes Relative: 44 %
Lymphs Abs: 3 10*3/uL (ref 0.7–4.0)
MCH: 31.1 pg (ref 26.0–34.0)
MCHC: 33.7 g/dL (ref 30.0–36.0)
MCV: 92.2 fL (ref 80.0–100.0)
Monocytes Absolute: 0.6 10*3/uL (ref 0.1–1.0)
Monocytes Relative: 9 %
Neutro Abs: 3 10*3/uL (ref 1.7–7.7)
Neutrophils Relative %: 44 %
Platelets: 171 10*3/uL (ref 150–400)
RBC: 4.99 MIL/uL (ref 4.22–5.81)
RDW: 13 % (ref 11.5–15.5)
WBC: 6.8 10*3/uL (ref 4.0–10.5)
nRBC: 0 % (ref 0.0–0.2)

## 2019-09-03 LAB — COMPREHENSIVE METABOLIC PANEL
ALT: 37 U/L (ref 0–44)
AST: 34 U/L (ref 15–41)
Albumin: 4.1 g/dL (ref 3.5–5.0)
Alkaline Phosphatase: 63 U/L (ref 38–126)
Anion gap: 11 (ref 5–15)
BUN: 17 mg/dL (ref 8–23)
CO2: 21 mmol/L — ABNORMAL LOW (ref 22–32)
Calcium: 9 mg/dL (ref 8.9–10.3)
Chloride: 108 mmol/L (ref 98–111)
Creatinine, Ser: 1.38 mg/dL — ABNORMAL HIGH (ref 0.61–1.24)
GFR calc Af Amer: >60 mL/min (ref >60)
GFR calc non Af Amer: 55 mL/min — ABNORMAL LOW (ref >60)
Glucose, Bld: 130 mg/dL — ABNORMAL HIGH (ref 70–99)
Potassium: 3.6 mmol/L (ref 3.5–5.1)
Sodium: 140 mmol/L (ref 135–145)
Total Bilirubin: 0.6 mg/dL (ref 0.3–1.2)
Total Protein: 6.7 g/dL (ref 6.5–8.1)

## 2019-09-03 LAB — TROPONIN I (HIGH SENSITIVITY)
Troponin I (High Sensitivity): 6 ng/L (ref <18)
Troponin I (High Sensitivity): 51 ng/L — ABNORMAL HIGH (ref <18)
Troponin I (High Sensitivity): 58 ng/L — ABNORMAL HIGH (ref <18)

## 2019-09-03 LAB — CBG MONITORING, ED: Glucose-Capillary: 110 mg/dL — ABNORMAL HIGH (ref 70–99)

## 2019-09-03 MED ORDER — ASPIRIN 81 MG PO CHEW
324.0000 mg | CHEWABLE_TABLET | Freq: Once | ORAL | Status: AC
  Administered 2019-09-04: 324 mg via ORAL
  Filled 2019-09-03: qty 4

## 2019-09-03 MED ORDER — ONDANSETRON 4 MG PO TBDP
4.0000 mg | ORAL_TABLET | Freq: Once | ORAL | Status: DC
  Filled 2019-09-03: qty 1

## 2019-09-03 NOTE — ED Provider Notes (Signed)
[facility] EMERGENCY DEPARTMENT Provider Note   CSN: [id number] Arrival date & time: 09/03/19  1741     History   Chief Complaint No chief complaint on file.   HPI Barry Miles is a 63 y.o. male with a past medical history significant for GERD who presents to the ED on 9/27 due to a possible acute seizure-like event that occurred a few hours ago as he was riding in the car with his wife. Prior to the episode, patient exhibited tunnel vision and felt extremely light-headed, so he reached up to grab the handle in the car where his wife turned around and saw him with his head back on the seat, eyes opened, trying to speak to her, but unable to communicate. Patient's wife states he was moving both of his arms in a rhythmic manner. Patient did have an episode of urine incontinence during this episode. Patient admits to extreme diaphoresis and nausea after this event. The possible seizure-like event occurred for roughly 5 minutes. Patient denies history of seizures. Patient has no history of cardiac events or arrhythmias. Patient denies head injury. No new medications except for some vitamin supplements. No drug or alcohol usage. Patient has never had an episode like this before. Patient denies recent illness, fever, chest pain, shortness of breath.  Past Medical History:  Diagnosis Date  . Arthritis   . Arthritis   . Carpal tunnel syndrome on both sides   . Erectile dysfunction   . GERD (gastroesophageal reflux disease)   . Vertigo     Patient Active Problem List   Diagnosis Date Noted  . Obesity 03/18/2017  . Erectile dysfunction   . GERD (gastroesophageal reflux disease) 03/06/2014  . Carpal tunnel syndrome on both sides     Past Surgical History:  Procedure Laterality Date  . COLONOSCOPY    . POLYPECTOMY    . TONSILLECTOMY    . WISDOM TOOTH EXTRACTION          Home Medications    Prior to Admission medications   Medication Sig Start Date End Date  Taking? Authorizing Provider  CALCIUM CARBONATE PO Take 1,000 mg by mouth as needed.    [provider]  Cholecalciferol (VITAMIN D3) 2000 UNITS TABS Take 3 capsules by mouth 3 (three) times daily.    [provider]  ibuprofen (ADVIL,MOTRIN) 200 MG tablet Take 200 mg by mouth every 6 (six) hours as needed.    [provider]  Magnesium 400 MG CAPS Take 400 mg by mouth daily.    [provider]  meloxicam (MOBIC) 15 MG tablet 1 TAB DAILY WITH FOOD X 2 WEEKS THEN AS NEEDED DAILY FOR PAIN.NO ALEVE/IBUPROFEN, CAN TAKE TYLENOL 05/11/17   Unk Pinto, MD  Multiple Vitamins-Minerals (MULTIVITAMIN WITH MINERALS) tablet Take 1 tablet by mouth daily.    [provider]  omeprazole (PRILOSEC) 40 MG capsule Take 1 capsule (40 mg total) by mouth daily. 03/22/18   Vicie Mutters, PA-C  tadalafil (CIALIS) 20 MG tablet Take 1 tablet (20 mg total) by mouth daily as needed for erectile dysfunction. 05/25/19   Garnet Sierras, NP  vitamin C (ASCORBIC ACID) 500 MG tablet Take 500 mg by mouth daily.    [provider]    Family History Family History  Problem Relation Age of Onset  . Diabetes Mother   . Breast cancer Mother   . Diabetes Father   . Heart disease Father   . Colon polyps Maternal Uncle   .  Diabetes Maternal Grandmother   . Diabetes Paternal Grandmother   . Colon polyps Maternal Aunt   . Colon cancer Neg Hx   . Esophageal cancer Neg Hx   . Stomach cancer Neg Hx   . Rectal cancer Neg Hx   . Pancreatic cancer Neg Hx     Social History Social History   Tobacco Use  . Smoking status: Never Smoker  . Smokeless tobacco: Never Used  Substance Use Topics  . Alcohol use: No  . Drug use: No     Allergies   Patient has no known allergies.   Review of Systems Review of Systems  Constitutional: Negative for chills and fever.  Eyes: Negative for visual disturbance.  Respiratory: Negative for shortness of breath.   Cardiovascular:  Negative for chest pain and palpitations.  Gastrointestinal: Negative for abdominal distention and abdominal pain.  Genitourinary: Negative for dysuria.  Skin: Negative for color change and rash.  Neurological: Positive for speech difficulty (during episode, back to baseline now), weakness (felt weak after episode) and light-headedness. Negative for dizziness and headaches.  All other systems reviewed and are negative.    Physical Exam Updated Vital Signs BP (!) 144/96   Pulse 80   Temp 98.7 F (37.1 C) (Oral)   Resp 18   Ht 5\' 8"  (1.727 m)   Wt 102.1 kg   SpO2 97%   BMI 34.21 kg/m   Physical Exam Constitutional:      General: He is not in acute distress.    Appearance: He is not ill-appearing.  HENT:     Head: Normocephalic.  Eyes:     Pupils: Pupils are equal, round, and reactive to light.  Neck:     Musculoskeletal: Neck supple.  Cardiovascular:     Rate and Rhythm: Normal rate and regular rhythm.     Pulses: Normal pulses.     Heart sounds: Normal heart sounds. No murmur. No friction rub. No gallop.   Pulmonary:     Effort: Pulmonary effort is normal.     Breath sounds: Normal breath sounds.  Abdominal:     General: Abdomen is flat. Bowel sounds are normal. There is no distension.     Palpations: Abdomen is soft.  Musculoskeletal: Normal range of motion.  Skin:    General: Skin is warm.   Neurological: Mental Status: Alert, oriented, thought content appropriate. Speech fluent without evidence of aphasia. Able to follow 2 step commands without difficulty.  Cranial Nerves:  III,IV, VI: pupils equal, round, reactive to light, ptosis not present V,VII: smile symmetric, facial light touch sensation equal VIII: hearing grossly normal bilaterally  IX,X: midline uvula rise  XI: bilateral shoulder shrug equal and strong XII: midline tongue extension  Motor:  5/5 in upper and lower extremities bilaterally including strong and equal grip strength and  dorsiflexion/plantar flexion Sensory: sensation normal in all extremities.  Cerebellar: normal finger-to-nose with bilateral upper extremities Gait: normal gait and balance   ED Treatments / Results  Labs (all labs ordered are listed, but only abnormal results are displayed) Labs Reviewed  COMPREHENSIVE METABOLIC PANEL - Abnormal; Notable for the following components:      Result Value   CO2 21 (*)    Glucose, Bld 130 (*)    Creatinine, Ser 1.38 (*)    GFR calc non Af Amer 55 (*)    All other components within normal limits  CBG MONITORING, ED - Abnormal; Notable for the following components:   Glucose-Capillary 110 (*)  All other components within normal limits  TROPONIN I (HIGH SENSITIVITY) - Abnormal; Notable for the following components:   Troponin I (High Sensitivity) 51 (*)    All other components within normal limits  TROPONIN I (HIGH SENSITIVITY) - Abnormal; Notable for the following components:   Troponin I (High Sensitivity) 58 (*)    All other components within normal limits  CBC WITH DIFFERENTIAL/PLATELET  TROPONIN I (HIGH SENSITIVITY)    EKG EKG Interpretation  Date/Time:  Sunday September 03 2019 21:27:38 EDT Ventricular Rate:  84 PR Interval:    QRS Duration: 95 QT Interval:  369 QTC Calculation: 437 R Axis:   8 Text Interpretation:  Sinus rhythm Low voltage, precordial leads Borderline repolarization abnormality No old tracing to compare Confirmed by Noemi Chapel [phone]) on 09/03/2019 10:06:22 PM   Radiology Dg Chest 2 View  Result Date: 09/03/2019 CLINICAL DATA:  Patient fainted.  Weakness and nausea. EXAM: CHEST - 2 VIEW COMPARISON:  December 17, 2011 FINDINGS: The heart size and mediastinal contours are within normal limits. Both lungs are clear. The visualized skeletal structures are unremarkable. IMPRESSION: No active cardiopulmonary disease. Electronically Signed   By: Dorise Bullion III M.D   On: 09/03/2019 18:50   Ct Head Wo Contrast  Result  Date: 09/03/2019 CLINICAL DATA:  New seizure, nontraumatic EXAM: CT HEAD WITHOUT CONTRAST TECHNIQUE: Contiguous axial images were obtained from the base of the skull through the vertex without intravenous contrast. COMPARISON:  None. FINDINGS: Brain: No evidence of acute infarction, hemorrhage, hydrocephalus, extra-axial collection or mass lesion/mass effect. Vascular: Atherosclerotic calcification of the carotid siphons. No hyperdense vessel. Skull: No calvarial fracture or suspicious osseous lesion. No scalp swelling or hematoma. Sinuses/Orbits: Paranasal sinuses and mastoid air cells are predominantly clear. Included orbital structures are unremarkable. Other: None IMPRESSION: Normal for age noncontrast head CT. Electronically Signed   By: Lovena Le M.D.   On: 09/03/2019 21:46    Procedures Procedures (including critical care time)  Medications Ordered in ED Medications  ondansetron (ZOFRAN-ODT) disintegrating tablet 4 mg (4 mg Oral Not Given 09/03/19 2205)  aspirin chewable tablet 324 mg (has no administration in time range)     Initial Impression / Assessment and Plan / ED Course  I have reviewed the triage vital signs and the nursing notes.  Pertinent labs & imaging results that were available during my care of the patient were reviewed by me and considered in my medical decision making (see chart for details).  Barry Miles is a 63 year old male presenting to the ED after a possible seizure-like event. Patient has no significant medical history that could account for such episode. Physical exam was unremarkable with a fully normal neurological exam. Per patient's wife at bedside, he is back to baseline. Troponin, CBC, CMP, and head CT will be ordered to rule out metabolic and neurological etiologies of event.   Initial troponin was mildly elevated at 51. Will get 2nd troponin. Do not suspect cardiac related; however EKG demonstrated nonspecific ST changes and T-waves. Some changes  when compared to old EKGs. Most likely benign, but will wait on 2nd troponin level.   Final troponin is still trending up. Cardiology was consulted. Patient will be admitted for further cardiology workup. Discussed plan with patient who agrees. Patient's case was handed off to Dr. Leonette Monarch at shift change.  Final Clinical Impressions(s) / ED Diagnoses   Final diagnoses:  Seizure ([place])  Elevated troponin    ED Discharge Orders    None  Romie Levee 09/04/19 0018    Noemi Chapel, MD 09/07/19 [phone]

## 2019-09-03 NOTE — ED Provider Notes (Signed)
63 y/o male - near syncope - had tunnel vision prior to the event - with some light headedness - while in the car - head was rested back - arms were moving rhythmically - no recollection of this evern - had some incontinence - was diaphoretic, confused and nasueated - vomiting  X 1 in waitin groom.    No head injury, no new medications other than vitamins including vitamin D, vitamin C and zinc, no other over-the-counter medications, no drugs alcohol or tobacco.  Exam:  Normal exam - neuro reassuring, cardiac exam unremarkable, no c/o  At baseline now.  CT head - r/o stroke / mass / etc.  Anticipated d/c unless patholgoical findings requiring admission.   I spoke with the patient and his spouse extensively regarding seizure precautions, they are agreeable.  Discussed with the neurologist, he agrees that the patient can have an outpatient work-up, [facility], no driving, patient expressed understanding.  I doubt that the troponin has anything to do with the patient's current condition, he has no chest pain reports that he had a negative cardiac work-up in the past because of a prior history of an abnormal EKG, he states that work-up was negative.  Due to the abnormal EKG we will repeat a troponin here.  The patient's troponin is elevated higher, with his abnormal EKG there is some concern that this could be arrhythmia related seizure activity.  He has not had any further arrhythmias since arrival as being watched on the cardiac monitor.  Cardiology will be consulted, likely hospitalist admit, neurology has already said they will consult inpatient if the patient is admitted.  The patient was informed of his results and is agreeable to the admission   EKG Interpretation  Date/Time:  Sunday September 03 2019 21:27:38 EDT Ventricular Rate:  84 PR Interval:    QRS Duration: 95 QT Interval:  369 QTC Calculation: 437 R Axis:   8 Text Interpretation:  Sinus rhythm Low voltage, precordial  leads Borderline repolarization abnormality No old tracing to compare Confirmed by Noemi Chapel [phone]) on 09/03/2019 10:06:22 PM        Medical screening examination/treatment/procedure(s) were conducted as a shared visit with non-physician practitioner(s) and myself.  I personally evaluated the patient during the encounter.  Clinical Impression:   Final diagnoses:  Seizure ([place])  Elevated troponin         Noemi Chapel, MD 09/07/19 [phone]

## 2019-09-03 NOTE — ED Notes (Addendum)
Date and time results received: 09/03/19 "9:42 PM  Test: Troponin Critical Value: 51  Name of Provider Notified: Janetta Hora  Orders Received? Or Actions Taken?: none at this time

## 2019-09-03 NOTE — ED Triage Notes (Signed)
Pt here with c/o syncopal episode , pt states that he was just riding in the car got arm and passed ou t, pt was incontinent of urine , pt awake and alert now

## 2019-09-04 ENCOUNTER — Observation Stay (HOSPITAL_BASED_OUTPATIENT_CLINIC_OR_DEPARTMENT_OTHER): Payer: 59

## 2019-09-04 ENCOUNTER — Observation Stay (HOSPITAL_COMMUNITY): Payer: 59

## 2019-09-04 ENCOUNTER — Encounter (HOSPITAL_COMMUNITY): Payer: Self-pay | Admitting: Internal Medicine

## 2019-09-04 ENCOUNTER — Other Ambulatory Visit: Payer: Self-pay

## 2019-09-04 ENCOUNTER — Telehealth (HOSPITAL_COMMUNITY): Payer: Self-pay | Admitting: Emergency Medicine

## 2019-09-04 DIAGNOSIS — Z114 Encounter for screening for human immunodeficiency virus [HIV]: Secondary | ICD-10-CM | POA: Insufficient documentation

## 2019-09-04 DIAGNOSIS — R778 Other specified abnormalities of plasma proteins: Secondary | ICD-10-CM | POA: Diagnosis present

## 2019-09-04 DIAGNOSIS — R42 Dizziness and giddiness: Secondary | ICD-10-CM

## 2019-09-04 DIAGNOSIS — K219 Gastro-esophageal reflux disease without esophagitis: Secondary | ICD-10-CM | POA: Diagnosis not present

## 2019-09-04 DIAGNOSIS — I10 Essential (primary) hypertension: Secondary | ICD-10-CM | POA: Diagnosis present

## 2019-09-04 DIAGNOSIS — R7989 Other specified abnormal findings of blood chemistry: Secondary | ICD-10-CM

## 2019-09-04 DIAGNOSIS — R569 Unspecified convulsions: Secondary | ICD-10-CM

## 2019-09-04 DIAGNOSIS — R000 Tachycardia, unspecified: Secondary | ICD-10-CM | POA: Diagnosis present

## 2019-09-04 DIAGNOSIS — R55 Syncope and collapse: Secondary | ICD-10-CM

## 2019-09-04 DIAGNOSIS — N179 Acute kidney failure, unspecified: Secondary | ICD-10-CM | POA: Diagnosis present

## 2019-09-04 DIAGNOSIS — R9431 Abnormal electrocardiogram [ECG] [EKG]: Secondary | ICD-10-CM | POA: Diagnosis not present

## 2019-09-04 LAB — SARS CORONAVIRUS 2 (TAT 6-24 HRS): SARS Coronavirus 2: NEGATIVE

## 2019-09-04 LAB — ECHOCARDIOGRAM COMPLETE
Height: 69 in
Weight: 3571.2 oz

## 2019-09-04 LAB — TROPONIN I (HIGH SENSITIVITY): Troponin I (High Sensitivity): 63 ng/L — ABNORMAL HIGH (ref <18)

## 2019-09-04 LAB — HIV ANTIBODY (ROUTINE TESTING W REFLEX): HIV Screen 4th Generation wRfx: NONREACTIVE

## 2019-09-04 LAB — D-DIMER, QUANTITATIVE: D-Dimer, Quant: 0.42 ug/mL-FEU (ref 0.00–0.50)

## 2019-09-04 MED ORDER — VITAMIN D 25 MCG (1000 UNIT) PO TABS
6000.0000 [IU] | ORAL_TABLET | Freq: Every day | ORAL | Status: DC
  Administered 2019-09-04: 6000 [IU] via ORAL
  Filled 2019-09-04: qty 6

## 2019-09-04 MED ORDER — SODIUM CHLORIDE 0.9 % IV BOLUS
250.0000 mL | Freq: Once | INTRAVENOUS | Status: AC
  Administered 2019-09-04: 250 mL via INTRAVENOUS

## 2019-09-04 MED ORDER — PANTOPRAZOLE SODIUM 40 MG PO TBEC
40.0000 mg | DELAYED_RELEASE_TABLET | Freq: Every day | ORAL | Status: DC
  Administered 2019-09-04: 11:00:00 40 mg via ORAL
  Filled 2019-09-04: qty 1

## 2019-09-04 MED ORDER — MAGNESIUM 250 MG PO TABS: 200.0000 mg | ORAL_TABLET | Freq: Every day | ORAL | 0 refills | Status: DC

## 2019-09-04 MED ORDER — IOHEXOL 350 MG/ML SOLN
80.0000 mL | Freq: Once | INTRAVENOUS | Status: AC | PRN
  Administered 2019-09-04: 80 mL via INTRAVENOUS

## 2019-09-04 MED ORDER — SODIUM CHLORIDE 0.9 % IV BOLUS
1000.0000 mL | Freq: Once | INTRAVENOUS | Status: AC
  Administered 2019-09-04: 1000 mL via INTRAVENOUS

## 2019-09-04 MED ORDER — HYDRALAZINE HCL 10 MG PO TABS: 10.0000 mg | ORAL_TABLET | Freq: Four times a day (QID) | ORAL | Status: DC | PRN

## 2019-09-04 MED ORDER — ACETAMINOPHEN 650 MG RE SUPP: 650.0000 mg | Freq: Four times a day (QID) | RECTAL | Status: DC | PRN

## 2019-09-04 MED ORDER — NITROGLYCERIN 0.4 MG SL SUBL: 0.8000 mg | SUBLINGUAL_TABLET | SUBLINGUAL | Status: DC | PRN

## 2019-09-04 MED ORDER — ACETAMINOPHEN 325 MG PO TABS: 650.0000 mg | ORAL_TABLET | Freq: Four times a day (QID) | ORAL | Status: DC | PRN

## 2019-09-04 MED ORDER — METOPROLOL TARTRATE 50 MG PO TABS
100.0000 mg | ORAL_TABLET | Freq: Once | ORAL | Status: AC
  Administered 2019-09-04: 100 mg via ORAL
  Filled 2019-09-04: qty 2

## 2019-09-04 MED ORDER — VITAMIN C 500 MG PO TABS
500.0000 mg | ORAL_TABLET | Freq: Every day | ORAL | Status: DC
  Administered 2019-09-04: 500 mg via ORAL
  Filled 2019-09-04: qty 1

## 2019-09-04 MED ORDER — ENOXAPARIN SODIUM 40 MG/0.4ML ~~LOC~~ SOLN
40.0000 mg | SUBCUTANEOUS | Status: DC
  Filled 2019-09-04: qty 0.4

## 2019-09-04 MED ORDER — NITROGLYCERIN 0.4 MG SL SUBL
SUBLINGUAL_TABLET | SUBLINGUAL | Status: AC
  Administered 2019-09-04: 0.8 mg
  Filled 2019-09-04: qty 2

## 2019-09-04 MED ORDER — SODIUM CHLORIDE 0.9% FLUSH
3.0000 mL | Freq: Two times a day (BID) | INTRAVENOUS | Status: DC
  Administered 2019-09-04 (×2): 3 mL via INTRAVENOUS

## 2019-09-04 MED ORDER — ADULT MULTIVITAMIN W/MINERALS CH
1.0000 | ORAL_TABLET | Freq: Every day | ORAL | Status: DC
  Administered 2019-09-04: 1 via ORAL
  Filled 2019-09-04: qty 1

## 2019-09-04 MED ORDER — RABEPRAZOLE SODIUM 20 MG PO TBEC: 20.0000 mg | DELAYED_RELEASE_TABLET | Freq: Every day | ORAL | 1 refills | Status: DC

## 2019-09-04 MED ORDER — ZINC SULFATE 220 (50 ZN) MG PO CAPS
220.0000 mg | ORAL_CAPSULE | Freq: Every day | ORAL | Status: DC
  Administered 2019-09-04: 220 mg via ORAL
  Filled 2019-09-04 (×2): qty 1

## 2019-09-04 NOTE — Progress Notes (Signed)
  Echocardiogram 2D Echocardiogram has been performed.  Barry Miles 09/04/2019, 1:52 PM

## 2019-09-04 NOTE — Discharge Summary (Signed)
Physician Discharge Summary  Barry Miles [id number] DOB: 1955-12-21 DOA: 09/03/2019  PCP: Unk Pinto, MD  Admit date: 09/03/2019 Discharge date: 09/04/2019  Admitted From: home Discharge disposition: home   Recommendations for Outpatient Follow-Up:   1. BMP and Mg in 1 week 2. Monitor Qtc   Discharge Diagnosis:   Principal Problem:   Syncope Active Problems:   GERD (gastroesophageal reflux disease)   Elevated troponin   Vertigo   Seizure (HCC)   Acute kidney injury ([place])   Hypertension   Tachycardia    Discharge Condition: Improved.  Diet recommendation: gerd  Wound care: None.  Code status: Full.   History of Present Illness:   Barry Miles is a 63 y.o. male with medical history significant of arthritis, GERD, vertigo presenting to the hospital for evaluation after syncopal episode.  Patient states he was in his usual state of health and all of a sudden passed out in the evening around 5 PM.  He was in the car with his wife and sister-in-law.  His sister-in-law was driving and he was sitting in the backseat.  He just remembers feeling dizzy and his vision turning dark.  He was not having any chest pain, shortness of breath, or heart palpitations. Wife at bedside states patient was unresponsive for several minutes.  His eyes were open and he was making some noises.  She did not notice any shaking of his body or drooling.  States when patient finally responded to her, he was confused and did not know what had happened.  Patient had urinary incontinence during this episode.  She did not notice any focal weakness, slurring of speech, or facial droop.  He was weak all over but was able to walk to the emergency room.  Wife noticed that patient was sweating profusely and his extremities were cold during this syncopal episode.  In the waiting area he had an episode of vomiting.  Patient denies abdominal pain or diarrhea.  Denies history of seizures, CAD, or  blood clots.  Denies recent illness   Hospital Course by Problem:   Syncopal event. -differentials include arrhythmia (note from PCP mentions prolonged Qt-- supplement Mg), PE (ddimer), stroke (MRI negative), orthostasis (negative, seizures (EEG normal),andvasovagal. Hemodynamically stable in the ED. Chest x-ray showing no active cardiopulmonary disease. EKG with mild changes compared to prior tracing.High-sensitivity troponin 6 >51 >58.Echo with EF 60%, normal left ventriclePatient denied chest pain and appeared comfortable on exam. -cardiac CTA - low risk -outpatient cardiology follow up ? 30 day event monitor -seizures precautions discussed with patient-- referral made to neurology as well per family request  Elevated troponin.   -monitor on tele - cardiac CT minimal disease  Acute kidney injury.  -IVF given -outpatient recheck  Hypertension/tachycardia. No hx of same. No home meds. Given lopressor x1 pre cardiac CT. May be related to above -outpatient cardiology follow up  GERD -Continue PPI-- stopped taking his magnesium supplement-- asked him to resume -takes prilosec daily for >3 years  HIV screening -negative    Medical Consultants:    Neuro (phone) cards   Discharge Exam:   Vitals:   09/04/19 1147 09/04/19 1150  BP:    Pulse: (!) 102 (!) 105  Resp:    Temp:    SpO2:     Vitals:   09/04/19 1144 09/04/19 1145 09/04/19 1147 09/04/19 1150  BP:      Pulse: 84 97 (!) 102 (!) 105  Resp:      Temp:  TempSrc:      SpO2:      Weight:      Height:        General exam: Appears calm and comfortable.  The results of significant diagnostics from this hospitalization (including imaging, microbiology, ancillary and laboratory) are listed below for reference.     Procedures and Diagnostic Studies:   Dg Chest 2 View  Result Date: 09/03/2019 CLINICAL DATA:  Patient fainted.  Weakness and nausea. EXAM: CHEST - 2 VIEW COMPARISON:  December 17, 2011 FINDINGS: The heart size and mediastinal contours are within normal limits. Both lungs are clear. The visualized skeletal structures are unremarkable. IMPRESSION: No active cardiopulmonary disease. Electronically Signed   By: Dorise Bullion III M.D   On: 09/03/2019 18:50   Ct Head Wo Contrast  Result Date: 09/03/2019 CLINICAL DATA:  New seizure, nontraumatic EXAM: CT HEAD WITHOUT CONTRAST TECHNIQUE: Contiguous axial images were obtained from the base of the skull through the vertex without intravenous contrast. COMPARISON:  None. FINDINGS: Brain: No evidence of acute infarction, hemorrhage, hydrocephalus, extra-axial collection or mass lesion/mass effect. Vascular: Atherosclerotic calcification of the carotid siphons. No hyperdense vessel. Skull: No calvarial fracture or suspicious osseous lesion. No scalp swelling or hematoma. Sinuses/Orbits: Paranasal sinuses and mastoid air cells are predominantly clear. Included orbital structures are unremarkable. Other: None IMPRESSION: Normal for age noncontrast head CT. Electronically Signed   By: Lovena Le M.D.   On: 09/03/2019 21:46   Mr Brain Wo Contrast  Result Date: 09/04/2019 CLINICAL DATA:  Seizure, new, nontraumatic. EXAM: MRI HEAD WITHOUT CONTRAST TECHNIQUE: Multiplanar, multiecho pulse sequences of the brain and surrounding structures were obtained without intravenous contrast. COMPARISON:  Head CT from yesterday FINDINGS: Brain: No cortical finding to explain seizure, including at the bilateral hippocampus. No acute infarction, hemorrhage, hydrocephalus, extra-axial collection or mass lesion. Mild patchy FLAIR hyperintensity in the cerebral white matter, usually chronic small vessel ischemia by the 6th decade. Normal brain volume. Vascular: Normal flow voids Skull and upper cervical spine: Negative for marrow lesion Sinuses/Orbits: Negative IMPRESSION: 1. No acute finding or explanation for seizure. 2. Mild nonspecific white matter disease.  Electronically Signed   By: Monte Fantasia M.D.   On: 09/04/2019 05:56   Ct Coronary Morph W/cta Cor W/score W/ca W/cm &/or Wo/cm  Addendum Date: 09/04/2019   ADDENDUM REPORT: 09/04/2019 17:08 CLINICAL DATA:  Chest pain/Syncope EXAM: Cardiac/Coronary CTA TECHNIQUE: The patient was scanned on a [employer]. A 100 kV prospective scan was triggered in the descending thoracic aorta at 111 HU's. Axial non-contrast 3 mm slices were carried out through the heart. The data set was analyzed on a dedicated work station and scored using the [place]. Gantry rotation speed was 250 msecs and collimation was .6 mm. No beta blockade and 0.8 mg of sl NTG was given. The 3D data set was reconstructed in 5% intervals of the 35-75 % of the R-R cycle. Diastolic phases were analyzed on a dedicated work station using MPR, MIP and VRT modes. The patient received 80 cc of contrast. FINDINGS: Image quality: Average. Noise artifact is: Limited. Movement artifact noted. Coronary Arteries:  Normal coronary origin.  Right dominance. Left main: The left main is a large caliber vessel with a normal take off from the left coronary cusp that bifurcates to form a left anterior descending artery and a left circumflex artery. There is no plaque or stenosis. Left anterior descending artery: The proximal LAD contains minimal non-calcified plaque (<25%). 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 The mid and distal segments are free of plaque or stenosis. The LAD gives off 2 patent diagonal branches. Left circumflex artery: The LCX is non-dominant and patent with no evidence of plaque or stenosis. The LCX gives off 2 patent obtuse marginal branches. Right coronary artery: The RCA is dominant with normal take off from the right coronary cusp. There is a stair step artifact in the proximal to mid RCA that is likely normal. The RCA is free of plaque and stenosis. The RCA terminates as a PDA and right posterolateral branch  without evidence of plaque or stenosis. Right Atrium: Right atrial size is within normal limits. Right Ventricle: The right ventricular cavity is within normal limits. Left Atrium: Left atrial size is normal in size with no left atrial appendage filling defect. Left Ventricle: The ventricular cavity size is within normal limits. There are no stigmata of prior infarction. There is no abnormal filling defect. Pulmonary arteries: Normal in size without proximal filling defect. Pulmonary veins: Normal pulmonary venous drainage. Pericardium: Normal thickness with no significant effusion or calcium present. Cardiac valves: The aortic valve is trileaflet without significant calcification. The mitral valve is normal structure without significant calcification. Aorta: Normal caliber with minimal calcified plaque. Extra-cardiac findings: See attached radiology report for non-cardiac structures. IMPRESSION: 1. Coronary calcium score of 0. 2. Normal coronary origin with right dominance. 3. Minimal non-calcified plaque in the pLAD (<25%) that is non-obstructive. Normal LCX. 4. Significant stairstep artifact noted from movement, but RCA appears to be normal without evidence of plaque or stenosis. 5. 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 CAD-RADS 1: Minimal non-obstructive CAD (0-24%). Consider non-atherosclerotic causes of chest pain. Consider preventive therapy and risk factor modification. Eleonore Chiquito, MD Electronically Signed   By: Eleonore Chiquito   On: 09/04/2019 17:08   Result Date: 09/04/2019 EXAM: OVER-READ INTERPRETATION  CT CHEST The following report is an over-read performed by radiologist Dr. Rolm Baptise of [facility], [place] on 09/04/2019. This over-read does not include interpretation of cardiac or coronary anatomy or pathology. The coronary CTA interpretation by the cardiologist is attached. COMPARISON:  None. FINDINGS: Vascular: Heart is mildly enlarged. Aorta  is normal caliber. Scattered aortic calcifications within the arch. Mediastinum/Nodes: Small hiatal hernia. No mediastinal, hilar, or axillary adenopathy. Trachea and esophagus are unremarkable. Lungs/Pleura: No confluent airspace opacities or effusions. Upper Abdomen: Multiple gallstones within the gallbladder. Suspect diffuse fatty infiltration of the liver. Musculoskeletal: Chest wall soft tissues are unremarkable. No acute bony abnormality. IMPRESSION: Small hiatal hernia. Cholelithiasis. Suspect fatty infiltration of the liver. No acute extra cardiac abnormality. Electronically Signed: By: Rolm Baptise M.D. On: 09/04/2019 16:40     Labs:   Basic Metabolic Panel: Recent Labs  Lab 09/03/19 1800  NA 140  K 3.6  CL 108  CO2 21*  GLUCOSE 130*  BUN 17  CREATININE 1.38*  CALCIUM 9.0   GFR Estimated Creatinine Clearance: 65.9 mL/min (A) (by C-G formula based on SCr of 1.38 mg/dL (H)). Liver Function Tests: Recent Labs  Lab 09/03/19 1800  AST 34  ALT 37  ALKPHOS 63  BILITOT 0.6  PROT 6.7  ALBUMIN 4.1   No results for input(s): LIPASE, AMYLASE in the last 168 hours. No results for input(s): AMMONIA in the last 168 hours. Coagulation profile No results for input(s): INR, PROTIME in the last 168 hours.  CBC: Recent Labs  Lab 09/03/19 1800  WBC 6.8  NEUTROABS 3.0  HGB 15.5  HCT 46.0  MCV 92.2  PLT 171   Cardiac Enzymes: No results for input(s): CKTOTAL, CKMB, CKMBINDEX, TROPONINI in the last 168 hours. BNP: Invalid input(s): POCBNP CBG: Recent Labs  Lab 09/03/19 1801  GLUCAP 110*   D-Dimer Recent Labs    09/04/19 0219  DDIMER 0.42   Hgb A1c No results for input(s): HGBA1C in the last 72 hours. Lipid Profile No results for input(s): CHOL, HDL, LDLCALC, TRIG, CHOLHDL, LDLDIRECT in the last 72 hours. Thyroid function studies No results for input(s): TSH, T4TOTAL, T3FREE, THYROIDAB in the last 72 hours.  Invalid input(s): FREET3 Anemia work up No results  for input(s): VITAMINB12, FOLATE, FERRITIN, TIBC, IRON, RETICCTPCT in the last 72 hours. Microbiology Recent Results (from the past 240 hour(s))  SARS CORONAVIRUS 2 (TAT 6-24 HRS) Nasopharyngeal Nasopharyngeal Swab     Status: None   Collection Time: 09/04/19 12:31 AM   Specimen: Nasopharyngeal Swab  Result Value Ref Range Status   SARS Coronavirus 2 NEGATIVE NEGATIVE Final    Comment: (NOTE) SARS-CoV-2 target nucleic acids are NOT DETECTED. The SARS-CoV-2 RNA is generally detectable in upper and lower respiratory specimens during the acute phase of infection. Negative results do not preclude SARS-CoV-2 infection, do not rule out co-infections with other pathogens, and should not be used as the sole basis for treatment or other patient management decisions. Negative results must be combined with clinical observations, patient history, and epidemiological information. The expected result is Negative. Fact Sheet for Patients: SugarRoll.be Fact Sheet for Healthcare Providers: https://www.woods-mathews.com/ This test is not yet approved or cleared by the [place] FDA and  has been authorized for detection and/or diagnosis of SARS-CoV-2 by FDA under an Emergency Use Authorization (EUA). This EUA will remain  in effect (meaning this test can be used) for the duration of the COVID-19 declaration under Section 56 4(b)(1) of the Act, 21 U.S.C. section 360bbb-3(b)(1), unless the authorization is terminated or revoked sooner. Performed at [facility], [place] [address], [place], [place] [address]      Discharge Instructions:   Discharge Instructions    Ambulatory referral to Neurology   Complete by: As directed    An appointment is requested in approximately: 2-3 weeks   Diet - low sodium heart healthy   Complete by: As directed    Increase activity slowly   Complete by: As directed      Allergies as of 09/04/2019   No Known  Allergies     Medication List    STOP taking these medications   ibuprofen 200 MG tablet Commonly known as: ADVIL   meloxicam 15 MG tablet Commonly known as: MOBIC   omeprazole 40 MG capsule Commonly known as: PriLOSEC     TAKE these medications   Magnesium 250 MG Tabs Take 0.8 tablets (200 mg total) by mouth daily.   multivitamin with minerals tablet Take 1 tablet by mouth daily.   RABEprazole 20 MG tablet Commonly known as: Aciphex Take 1 tablet (20 mg total) by mouth daily.   tadalafil 20 MG tablet Commonly known as: CIALIS Take 1 tablet (20 mg total) by mouth daily as needed for erectile dysfunction.   vitamin C 500 MG tablet Commonly known as: ASCORBIC ACID Take 500 mg by mouth daily.   Vitamin D3 50 MCG (2000 UT) Tabs Take 3 capsules by mouth daily.   zinc gluconate 50 MG tablet Take 50 mg by mouth daily.      Follow-up Information  Geralynn Rile, MD Follow up.   Specialties: Internal Medicine, Cardiology Why: Our office will call you to schedule follow-up visit in a couple of months. Contact information: [place] [address] [phone]        Unk Pinto, MD Follow up in 1 week(s).   Specialty: Internal Medicine Contact information: [id number] [place] [address] [phone]            Time coordinating discharge: 25 min  Signed:  Geradine Girt DO  [facility] 09/04/2019, 5:49 PM

## 2019-09-04 NOTE — Progress Notes (Addendum)
Progress Note  Patient Name: Barry Miles Date of Encounter: 09/04/2019  Primary Cardiologist: New (Dr. Audie Box)  Subjective   Mr. Barry Miles is a 63 year old male with medical history of GERD who presents for the evaluation of syncope at the request of Shela Leff, MD.   Mr. Barry Miles reports that he had a syncopal episode yesterday while driving as a passenger in a car.  He reports in the afternoon, he just became lightheaded and had blurry vision.  He reports he passed out for 5 to 7 minutes, and his wife confirms the story.  He reports that he came to pretty quickly without any deficits.  He reports he did lose his urine.  However he did not defecate on himself.  He denies any episodes of palpitations, chest pain, shortness of breath or other symptoms before this.  He reports no history of diabetes, and is on insulin therapy.  He has no prior history of seizures.  He reports he does not use alcohol or smoke cigarettes.  He states he takes no real medications.  He states he follows with his primary care physician on a yearly basis and has no real medical problems.  Laboratory data here shows high-sensitivity troponin has increased from 6->51->58->63.  He denies any episodes of chest pain.  Review of his EKG shows normal sinus rhythm with nonspecific T wave abnormalities.  There are no acute ischemic changes or prior infarction.  CT scan of the head is negative for stroke.  MRI is negative as well.  A chest x-ray was obtained that is normal.  Echocardiogram is pending.  Overall, he reports that he is still concerned about the event he had.  He states this is not normal for him and this is never happened before.  PMHX: GERD  PSURGHX: Tonsillectomy  Family Hx: Myocardial infarction in father at age 60  Allergies: None  Social: Denies alcohol or tobacco use  ROS: All other ROS reviewed and negative. Pertinent positives noted in the HPI.     Inpatient Medications    Scheduled  Meds: . cholecalciferol  6,000 Units Oral Daily  . enoxaparin (LOVENOX) injection  40 mg Subcutaneous Q24H  . multivitamin with minerals  1 tablet Oral Daily  . ondansetron  4 mg Oral Once  . pantoprazole  40 mg Oral Daily  . sodium chloride flush  3 mL Intravenous Q12H  . vitamin C  500 mg Oral Daily  . zinc sulfate  220 mg Oral Daily   Continuous Infusions:  PRN Meds: acetaminophen **OR** acetaminophen   Vital Signs    Vitals:   09/04/19 0345 09/04/19 0400 09/04/19 0415 09/04/19 0817  BP:   125/82 (!) 157/86  Pulse: 79 73 76 84  Resp: 18 (!) 30 15 16   Temp:    98.2 F (36.8 C)  TempSrc:    Oral  SpO2: 96% 93% 93% 95%  Weight:      Height:       No intake or output data in the 24 hours ending 09/04/19 0822 Last 3 Weights 09/03/2019 05/25/2019 10/03/2018  Weight (lbs) 225 lb 225 lb 222 lb  Weight (kg) 102.059 kg 102.059 kg 100.699 kg      Telemetry    Normal sinus rhythm with rates in the 70's to 80's. No arrhythmias or heart blocks noted. - Personally Reviewed  ECG    EKG demonstrates heart rate 84, normal sinus rhythm, nonspecific T wave abnormalities, no acute ischemic changes, no prior infarction  Physical Exam  GEN: Obese Caucasian male resting comfortably in no acute distress.   Neck: Supple. Cardiac: RRR. No murmurs, gallops, or rubs. Radial and distal pedal pulses 2+ and equal bilaterally. Respiratory: No increased work of breathing. Clear to auscultation bilaterally. No wheezes, rhonchi, or rales. GI: Soft, non-distended, and non-tender. MS: No lower extremity edema. No deformities. Skin: Warm and dry. Neuro:  No focal deficits. Psych: Normal affect. Responds appropriately.  Labs    High Sensitivity Troponin:   Recent Labs  Lab 09/03/19 1800 09/03/19 2042 09/03/19 2227 09/04/19 0342  TROPONINIHS 6 51* 58* 63*      Chemistry Recent Labs  Lab 09/03/19 1800  NA 140  K 3.6  CL 108  CO2 21*  GLUCOSE 130*  BUN 17  CREATININE 1.38*  CALCIUM  9.0  PROT 6.7  ALBUMIN 4.1  AST 34  ALT 37  ALKPHOS 63  BILITOT 0.6  GFRNONAA 55*  GFRAA >60  ANIONGAP 11     Hematology Recent Labs  Lab 09/03/19 1800  WBC 6.8  RBC 4.99  HGB 15.5  HCT 46.0  MCV 92.2  MCH 31.1  MCHC 33.7  RDW 13.0  PLT 171    BNPNo results for input(s): BNP, PROBNP in the last 168 hours.   DDimer  Recent Labs  Lab 09/04/19 0219  DDIMER 0.42     Radiology    Dg Chest 2 View  Result Date: 09/03/2019 CLINICAL DATA:  Patient fainted.  Weakness and nausea. EXAM: CHEST - 2 VIEW COMPARISON:  December 17, 2011 FINDINGS: The heart size and mediastinal contours are within normal limits. Both lungs are clear. The visualized skeletal structures are unremarkable. IMPRESSION: No active cardiopulmonary disease. Electronically Signed   By: Dorise Bullion III M.D   On: 09/03/2019 18:50   Ct Head Wo Contrast  Result Date: 09/03/2019 CLINICAL DATA:  New seizure, nontraumatic EXAM: CT HEAD WITHOUT CONTRAST TECHNIQUE: Contiguous axial images were obtained from the base of the skull through the vertex without intravenous contrast. COMPARISON:  None. FINDINGS: Brain: No evidence of acute infarction, hemorrhage, hydrocephalus, extra-axial collection or mass lesion/mass effect. Vascular: Atherosclerotic calcification of the carotid siphons. No hyperdense vessel. Skull: No calvarial fracture or suspicious osseous lesion. No scalp swelling or hematoma. Sinuses/Orbits: Paranasal sinuses and mastoid air cells are predominantly clear. Included orbital structures are unremarkable. Other: None IMPRESSION: Normal for age noncontrast head CT. Electronically Signed   By: Lovena Le M.D.   On: 09/03/2019 21:46   Mr Brain Wo Contrast  Result Date: 09/04/2019 CLINICAL DATA:  Seizure, new, nontraumatic. EXAM: MRI HEAD WITHOUT CONTRAST TECHNIQUE: Multiplanar, multiecho pulse sequences of the brain and surrounding structures were obtained without intravenous contrast. COMPARISON:  Head CT  from yesterday FINDINGS: Brain: No cortical finding to explain seizure, including at the bilateral hippocampus. No acute infarction, hemorrhage, hydrocephalus, extra-axial collection or mass lesion. Mild patchy FLAIR hyperintensity in the cerebral white matter, usually chronic small vessel ischemia by the 6th decade. Normal brain volume. Vascular: Normal flow voids Skull and upper cervical spine: Negative for marrow lesion Sinuses/Orbits: Negative IMPRESSION: 1. No acute finding or explanation for seizure. 2. Mild nonspecific white matter disease. Electronically Signed   By: Monte Fantasia M.D.   On: 09/04/2019 05:56    Cardiac Studies   Echo pending.  Patient Profile   Mr. Mould is a 63 y.o. male with a history of vertigo, GERD, and arthritis who presented to the ED on 09/03/2019 following a syncopal episode with seizure like activity. Cardiology was  consulted for evaluation of elevated troponin at the request of Dr. Sabra Heck.  Assessment & Plan    Elevated Troponin - High sensitivity troponin mildly elevated and flat at 6 >> 51 >> 58 >> 63. - EKG showed normal sinus rhythm with new mild ST depression and T wave inversion in inferior leads and leads V4-V6. - Echo pending. - Patient currently chest pain free and denies any recent angina. - Do not suspect ACS. More likely secondary to possible seizure.  - Patient has no known history of hypertension, hyperlipidemia, or diabetes. - Will repeat 12 lead today and wait for Echo results. Could consider possible outpatient work-up with Myoview or coronary CT pending results.  Syncope - Patient had witnessed syncopal episode in the car with possible jerking and urinary incontinence. No chest pain, shortness of breath, or palpitations prior to episode. Concern for seizure. - Head CT and brain MRI unremarkable. - Telemetry shows normal sinus rhythm with rates in the 70's to 80's. No tachyarrhythmias or heart blocks noted. - Echo pending. - D-dimer  negative. - Neurology has been consulted. EEG pending.  Hypertensive - Patient mildly hypertensive at this time with BP 157/86. - Patient has no diagnosis of hypertension. - Will hold off on adding any antihypertensive at this time but if BP remains elevated could consider.   For questions or updates, please contact [place] Please consult [email or web address] for contact info under     Signed, Darreld Mclean, PA-C  09/04/2019, 8:22 AM    Patient seen and examined, note reviewed with the signed Advanced Practice Provider. I personally reviewed laboratory data, imaging studies and relevant notes. I independently examined the patient and formulated the important aspects of the plan. I have personally discussed the plan with the patient and/or family. Comments or changes to the note/plan are indicated below.  This is a rather interesting story for a syncopal episode.  His neurological work-up from what I can tell is negative including negative CT head, negative MRI of head, negative EEG.  He reports no symptoms of chest pain or palpitations prior to the event.  He also describes an episode of incontinence.  His high-sensitivity troponins are slightly up.  His EKG is inconsistent with acute ischemia.  An echocardiogram is pending.  His cardiac examination is rather unremarkable for any valvular pathology or cardiac pathology.  Review of his telemetry shows normal sinus rhythm, with occasional PVCs.  There is no conduction disease on his telemetry or EKG.  Given his rather drastic symptoms, without identifiable pathology, I think it is reasonable to proceed with a cardiac CTA to exclude obstructive CAD.  We will also get a look at his aorta on this to exclude dissection as etiology as well.  He has no variation in pulses.  However, given his rather abrupt symptoms without etiology I think it is worthwhile to pursue the scan.  We will hydrate him with intravenous fluid, and give him 100 mg of metoprolol  tartrate prior to scan.  Signed, Addison Naegeli. Audie Box, [place]  09/04/2019 1:07 PM

## 2019-09-04 NOTE — H&P (Addendum)
History and Physical    Barry Miles [id number] DOB: July 10, 1956 DOA: 09/03/2019  PCP: Unk Pinto, MD Patient coming from: Home  Chief Complaint: Syncope  HPI: Barry Miles is a 63 y.o. male with medical history significant of arthritis, GERD, vertigo presenting to the hospital for evaluation after syncopal episode.  Patient states he was in his usual state of health and all of a sudden passed out in the evening around 5 PM.  He was in the car with his wife and sister-in-law.  His sister-in-law was driving and he was sitting in the backseat.  He just remembers feeling dizzy and his vision turning dark.  He was not having any chest pain, shortness of breath, or heart palpitations. Wife at bedside states patient was unresponsive for several minutes.  His eyes were open and he was making some noises.  She did not notice any shaking of his body or drooling.  States when patient finally responded to her, he was confused and did not know what had happened.  Patient had urinary incontinence during this episode.  She did not notice any focal weakness, slurring of speech, or facial droop.  He was weak all over but was able to walk to the emergency room.  Wife noticed that patient was sweating profusely and his extremities were cold during this syncopal episode.  In the waiting area he had an episode of vomiting.  Patient denies abdominal pain or diarrhea.  Denies history of seizures, CAD, or blood clots.  Denies recent illness.  ED Course: Hemodynamically stable.  Afebrile and no leukocytosis.  High-sensitivity troponin 6 >51 >58.  EKG with slight ST depression and T wave inversions in inferior and lateral leads.  EKG abnormalities are slightly more pronounced compared to prior EKG from 05/25/2019.  Chest x-ray showing no active cardiopulmonary disease.  Head CT negative for acute finding.  SARS-CoV-2 test pending. Patient received aspirin 324 mg.  Neurology consulted and recommended brain MRI to  rule out stroke.  Cardiology consulted and will see the patient tonight.  Review of Systems:  All systems reviewed and apart from history of presenting illness, are negative.  Past Medical History:  Diagnosis Date   Arthritis    Arthritis    Carpal tunnel syndrome on both sides    Erectile dysfunction    GERD (gastroesophageal reflux disease)    Vertigo     Past Surgical History:  Procedure Laterality Date   COLONOSCOPY     POLYPECTOMY     TONSILLECTOMY     WISDOM TOOTH EXTRACTION       reports that he has never smoked. He has never used smokeless tobacco. He reports that he does not drink alcohol or use drugs.  No Known Allergies  Family History  Problem Relation Age of Onset   Diabetes Mother    Breast cancer Mother    Diabetes Father    Heart disease Father    Colon polyps Maternal Uncle    Diabetes Maternal Grandmother    Diabetes Paternal Grandmother    Colon polyps Maternal Aunt    Colon cancer Neg Hx    Esophageal cancer Neg Hx    Stomach cancer Neg Hx    Rectal cancer Neg Hx    Pancreatic cancer Neg Hx     Prior to Admission medications   Medication Sig Start Date End Date Taking? Authorizing Provider  CALCIUM CARBONATE PO Take 1,000 mg by mouth as needed.    [provider]  Cholecalciferol (VITAMIN D3)  2000 UNITS TABS Take 3 capsules by mouth 3 (three) times daily.    [provider]  ibuprofen (ADVIL,MOTRIN) 200 MG tablet Take 200 mg by mouth every 6 (six) hours as needed.    [provider]  Magnesium 400 MG CAPS Take 400 mg by mouth daily.    [provider]  meloxicam (MOBIC) 15 MG tablet 1 TAB DAILY WITH FOOD X 2 WEEKS THEN AS NEEDED DAILY FOR PAIN.NO ALEVE/IBUPROFEN, CAN TAKE TYLENOL 05/11/17   Unk Pinto, MD  Multiple Vitamins-Minerals (MULTIVITAMIN WITH MINERALS) tablet Take 1 tablet by mouth daily.    [provider]  omeprazole (PRILOSEC) 40 MG capsule Take 1 capsule (40 mg  total) by mouth daily. 03/22/18   Vicie Mutters, PA-C  tadalafil (CIALIS) 20 MG tablet Take 1 tablet (20 mg total) by mouth daily as needed for erectile dysfunction. 05/25/19   Garnet Sierras, NP  vitamin C (ASCORBIC ACID) 500 MG tablet Take 500 mg by mouth daily.    [provider]    Physical Exam: Vitals:   09/03/19 2045 09/03/19 2100 09/03/19 2115 09/03/19 2130  BP: (!) 166/96 (!) 157/96 (!) 167/106 (!) 144/96  Pulse:  84 92 80  Resp: 18 19 16 18   Temp:      TempSrc:      SpO2:  96% 98% 97%  Weight:      Height:        Physical Exam  Constitutional: He is oriented to person, place, and time. He appears well-developed and well-nourished. No distress.  HENT:  Head: Normocephalic.  Mouth/Throat: Oropharynx is clear and moist.  Eyes: Pupils are equal, round, and reactive to light. EOM are normal.  Neck: Neck supple.  Cardiovascular: Normal rate, regular rhythm and intact distal pulses.  Pulmonary/Chest: Effort normal and breath sounds normal. No respiratory distress. He has no wheezes. He has no rales.  Abdominal: Soft. Bowel sounds are normal. He exhibits no distension. There is no abdominal tenderness. There is no guarding.  Musculoskeletal:        General: No edema.  Neurological: He is alert and oriented to person, place, and time. No cranial nerve deficit.  Speech fluent, tongue midline, no facial droop Strength 5 out of 5 in bilateral upper and lower extremities Sensation to light touch intact throughout  Skin: Skin is warm and dry. He is not diaphoretic.     Labs on Admission: I have personally reviewed following labs and imaging studies  CBC: Recent Labs  Lab 09/03/19 1800  WBC 6.8  NEUTROABS 3.0  HGB 15.5  HCT 46.0  MCV 92.2  PLT [id number]   Basic Metabolic Panel: Recent Labs  Lab 09/03/19 1800  NA 140  K 3.6  CL 108  CO2 21*  GLUCOSE 130*  BUN 17  CREATININE 1.38*  CALCIUM 9.0   GFR: Estimated Creatinine Clearance: 65.1 mL/min (A) (by  C-G formula based on SCr of 1.38 mg/dL (H)). Liver Function Tests: Recent Labs  Lab 09/03/19 1800  AST 34  ALT 37  ALKPHOS 63  BILITOT 0.6  PROT 6.7  ALBUMIN 4.1   No results for input(s): LIPASE, AMYLASE in the last 168 hours. No results for input(s): AMMONIA in the last 168 hours. Coagulation Profile: No results for input(s): INR, PROTIME in the last 168 hours. Cardiac Enzymes: No results for input(s): CKTOTAL, CKMB, CKMBINDEX, TROPONINI in the last 168 hours. BNP (last 3 results) No results for input(s): PROBNP in the last 8760 hours. HbA1C: No results for  input(s): HGBA1C in the last 72 hours. CBG: Recent Labs  Lab 09/03/19 1801  GLUCAP 110*   Lipid Profile: No results for input(s): CHOL, HDL, LDLCALC, TRIG, CHOLHDL, LDLDIRECT in the last 72 hours. Thyroid Function Tests: No results for input(s): TSH, T4TOTAL, FREET4, T3FREE, THYROIDAB in the last 72 hours. Anemia Panel: No results for input(s): VITAMINB12, FOLATE, FERRITIN, TIBC, IRON, RETICCTPCT in the last 72 hours. Urine analysis:    Component Value Date/Time   COLORURINE YELLOW 03/22/2018 1024   APPEARANCEUR CLEAR 03/22/2018 1024   LABSPEC 1.005 03/22/2018 1024   PHURINE 6.5 03/22/2018 1024   GLUCOSEU NEGATIVE 03/22/2018 1024   HGBUR NEGATIVE 03/22/2018 1024   BILIRUBINUR NEGATIVE 03/18/2017 1107   BILIRUBINUR neg 02/20/2012 1536   KETONESUR NEGATIVE 03/22/2018 1024   PROTEINUR NEGATIVE 03/22/2018 1024   UROBILINOGEN 0.2 03/07/2015 1111   NITRITE NEGATIVE 03/22/2018 1024   LEUKOCYTESUR NEGATIVE 03/22/2018 1024    Radiological Exams on Admission: Dg Chest 2 View  Result Date: 09/03/2019 CLINICAL DATA:  Patient fainted.  Weakness and nausea. EXAM: CHEST - 2 VIEW COMPARISON:  December 17, 2011 FINDINGS: The heart size and mediastinal contours are within normal limits. Both lungs are clear. The visualized skeletal structures are unremarkable. IMPRESSION: No active cardiopulmonary disease. Electronically  Signed   By: Dorise Bullion III M.D   On: 09/03/2019 18:50   Ct Head Wo Contrast  Result Date: 09/03/2019 CLINICAL DATA:  New seizure, nontraumatic EXAM: CT HEAD WITHOUT CONTRAST TECHNIQUE: Contiguous axial images were obtained from the base of the skull through the vertex without intravenous contrast. COMPARISON:  None. FINDINGS: Brain: No evidence of acute infarction, hemorrhage, hydrocephalus, extra-axial collection or mass lesion/mass effect. Vascular: Atherosclerotic calcification of the carotid siphons. No hyperdense vessel. Skull: No calvarial fracture or suspicious osseous lesion. No scalp swelling or hematoma. Sinuses/Orbits: Paranasal sinuses and mastoid air cells are predominantly clear. Included orbital structures are unremarkable. Other: None IMPRESSION: Normal for age noncontrast head CT. Electronically Signed   By: Lovena Le M.D.   On: 09/03/2019 21:46    EKG: Independently reviewed.  Sinus rhythm, slight ST depression and T wave inversions in inferior and lateral leads.  EKG abnormalities are slightly more pronounced compared to prior EKG from 05/25/2019.   Assessment/Plan Principal Problem:   Syncope Active Problems:   GERD (gastroesophageal reflux disease)   Encounter for screening for HIV   Syncope Concern for seizure activity based on description provided by the patient's wife.  Other differentials include arrhythmia, PE, stroke, orthostasis, and vasovagal.  Hemodynamically stable in the ED.  No tachycardia, tachypnea, hypoxia, or hypotension.  Infectious etiology less likely given no fever or leukocytosis.  Chest x-ray showing no active cardiopulmonary disease.  EKG with mild changes compared to prior tracing.  High-sensitivity troponin 6 >51 >58.  Cardiology has seen the patient and does not feel his presentation is related to ACS.  Patient denies chest pain and appears comfortable on exam. Head CT negative for acute finding.  Neuro exam nonfocal. Neurology consulted and  recommended brain MRI to rule out stroke. -Neurology consulted by ED provider -Cardiology consulted by ED provider -Cardiac monitoring -Received full dose aspirin -Trend troponin -Echocardiogram.  If normal, cardiology recommending outpatient follow-up for possible stress test. -Brain MRI -EEG -D-dimer -Orthostatics  GERD -Continue PPI  HIV screening The patient falls between the ages of 13-64 and should be screened for HIV, therefore HIV testing ordered.  DVT prophylaxis: Lovenox Code Status: Full code Family Communication: Wife at bedside. Disposition Plan:  Anticipate discharge in 1 to 2 days. Consults called: Cardiology, neurology Admission status: It is my clinical opinion that referral for OBSERVATION is reasonable and necessary in this patient based on the above information provided. The aforementioned taken together are felt to place the patient at high risk for further clinical deterioration. However it is anticipated that the patient may be medically stable for discharge from the hospital within 24 to 48 hours.  The medical decision making on this patient was of high complexity and the patient is at high risk for clinical deterioration, therefore this is a level 3 visit.  Shela Leff MD [facility] Pager [phone]  If 7PM-7AM, please contact night-coverage [email or web address] Password [email or web address]  09/04/2019, 1:34 AM

## 2019-09-04 NOTE — ED Notes (Signed)
Called pharmacy to get medications verified.

## 2019-09-04 NOTE — ED Notes (Signed)
TELE 

## 2019-09-04 NOTE — Telephone Encounter (Signed)
error 

## 2019-09-04 NOTE — Procedures (Signed)
Patient Name: Barry Miles  MRN: [id number]  Epilepsy Attending: Lora Havens  Referring Physician/Provider: Dr Santa Lighter Date: 09/04/2019 Duration: 27.41 mins  Patient history: 63 year old male with syncope.  EEG to evaluate for seizures.  Level of alertness: awake, asleep  AEDs during EEG study: None  Technical aspects: This EEG study was done with scalp electrodes positioned according to the 10-20 International system of electrode placement. Electrical activity was acquired at a sampling rate of 500Hz  and reviewed with a high frequency filter of 70Hz  and a low frequency filter of 1Hz . EEG data were recorded continuously and digitally stored.   Description: The posterior dominant rhythm consists of 8-9 Hz activity of moderate voltage (25-35 uV) seen predominantly in posterior head regions, symmetric and reactive to eye opening and eye closing.  EEG sleep was characterized by vertex waves, maximal frontocentral.  Physiologic photic driving was seen during photic stimulation.  Hyperventilation was not performed.  IMPRESSION: This study is within normal limits. No seizures or epileptiform discharges were seen throughout the recording.  Barry Miles [name]

## 2019-09-04 NOTE — Progress Notes (Signed)
   Spoke with Dr. Audie Box. Will plan for coronary CT later today. Have ordered one dose of Lopressor 100mg  to slow down heart rate for imaging. Creatinine mildly up today so will also give 1 L bolus of normal saline. Updated RN.  Darreld Mclean, PA-C 09/04/2019 1:07 PM

## 2019-09-04 NOTE — Progress Notes (Signed)
[facility] PROGRESS NOTE  Barry Miles [id number] DOB: Apr 06, 1956 DOA: 09/03/2019 PCP: Unk Pinto, MD  Assessment/Plan:  Syncope/seizure/stroke/tia. Concern for seizure activity based on description provided by the patient's wife.  Other differentials include arrhythmia, PE, stroke, orthostasis, and vasovagal.  Hemodynamically stable in the ED.  Chest x-ray showing no active cardiopulmonary disease.  EKG with mild changes compared to prior tracing.  High-sensitivity troponin 6 >51 >58. Echo with EF 60%, normal left ventricle Patient denied chest pain and appearsedcomfortable on exam. Not orthostatic. Head CT negative for acute finding.  Neuro exam nonfocal. MRI negative for infarct. EEG within limits of normal. Evaluated by cardiology who opined given drastic symptoms without identifiable pathology a cardiac CTA warranted to exclude obstructive CAD.  -continue full dose aspirin for now -appreciated cardiology input  Elevated troponin. See #1.  -monitor on tele -follow cardiac CT  Acute kidney injury. Creatinine 1.3. likely related to decreased oral intake  -gentle IV fluids -hold nephrotoxins -monitor urine output -recheck in am  Hypertension/tachycardia. No hx of same. No home meds. Given lopressor x1 pre cardiac CT. May be related to above -prn hydralazine -monitor  GERD -Continue PPI  HIV screening The patient falls between the ages of 13-64 and should be screened for HIV, therefore HIV testing ordered.   Code Status: full Family Communication: wife at bedside Disposition Plan: home hopefully this afternoon or in am   Consultants:  oniel cards  Procedures:  echo  Antibiotics:    HPI/Subjective: Sitting up in bed eating breakfast. Denies pain/discomfort  Objective: Vitals:   09/04/19 1147 09/04/19 1150  BP:    Pulse: (!) 102 (!) 105  Resp:    Temp:    SpO2:      Intake/Output Summary (Last 24 hours) at 09/04/2019 1506 Last data  filed at 09/04/2019 1048 Gross per 24 hour  Intake 3 ml  Output -  Net 3 ml   Filed Weights   09/03/19 1749 09/04/19 1141  Weight: 102.1 kg 101.2 kg    Exam:   General:  Awake alert no acute distress  Cardiovascular: tachycardia but regular no mgr no LE edema  Respiratory: normal effort BS clear bilaterally no wheeze  Abdomen: non-distended non-tender +BS no guarding or reboundign  Musculoskeletal: joints without swelling/erythema   Data Reviewed: Basic Metabolic Panel: Recent Labs  Lab 09/03/19 1800  NA 140  K 3.6  CL 108  CO2 21*  GLUCOSE 130*  BUN 17  CREATININE 1.38*  CALCIUM 9.0   Liver Function Tests: Recent Labs  Lab 09/03/19 1800  AST 34  ALT 37  ALKPHOS 63  BILITOT 0.6  PROT 6.7  ALBUMIN 4.1   No results for input(s): LIPASE, AMYLASE in the last 168 hours. No results for input(s): AMMONIA in the last 168 hours. CBC: Recent Labs  Lab 09/03/19 1800  WBC 6.8  NEUTROABS 3.0  HGB 15.5  HCT 46.0  MCV 92.2  PLT 171   Cardiac Enzymes: No results for input(s): CKTOTAL, CKMB, CKMBINDEX, TROPONINI in the last 168 hours. BNP (last 3 results) No results for input(s): BNP in the last 8760 hours.  ProBNP (last 3 results) No results for input(s): PROBNP in the last 8760 hours.  CBG: Recent Labs  Lab 09/03/19 1801  GLUCAP 110*    Recent Results (from the past 240 hour(s))  SARS CORONAVIRUS 2 (TAT 6-24 HRS) Nasopharyngeal Nasopharyngeal Swab     Status: None   Collection Time: 09/04/19 12:31 AM   Specimen: Nasopharyngeal Swab  Result  Value Ref Range Status   SARS Coronavirus 2 NEGATIVE NEGATIVE Final    Comment: (NOTE) SARS-CoV-2 target nucleic acids are NOT DETECTED. The SARS-CoV-2 RNA is generally detectable in upper and lower respiratory specimens during the acute phase of infection. Negative results do not preclude SARS-CoV-2 infection, do not rule out co-infections with other pathogens, and should not be used as the sole basis for  treatment or other patient management decisions. Negative results must be combined with clinical observations, patient history, and epidemiological information. The expected result is Negative. Fact Sheet for Patients: SugarRoll.be Fact Sheet for Healthcare Providers: https://www.woods-mathews.com/ This test is not yet approved or cleared by the [place] FDA and  has been authorized for detection and/or diagnosis of SARS-CoV-2 by FDA under an Emergency Use Authorization (EUA). This EUA will remain  in effect (meaning this test can be used) for the duration of the COVID-19 declaration under Section 56 4(b)(1) of the Act, 21 U.S.C. section 360bbb-3(b)(1), unless the authorization is terminated or revoked sooner. Performed at [facility], [place] [address], [place], [place] [address]      Studies: Dg Chest 2 View  Result Date: 09/03/2019 CLINICAL DATA:  Patient fainted.  Weakness and nausea. EXAM: CHEST - 2 VIEW COMPARISON:  December 17, 2011 FINDINGS: The heart size and mediastinal contours are within normal limits. Both lungs are clear. The visualized skeletal structures are unremarkable. IMPRESSION: No active cardiopulmonary disease. Electronically Signed   By: Dorise Bullion III M.D   On: 09/03/2019 18:50   Ct Head Wo Contrast  Result Date: 09/03/2019 CLINICAL DATA:  New seizure, nontraumatic EXAM: CT HEAD WITHOUT CONTRAST TECHNIQUE: Contiguous axial images were obtained from the base of the skull through the vertex without intravenous contrast. COMPARISON:  None. FINDINGS: Brain: No evidence of acute infarction, hemorrhage, hydrocephalus, extra-axial collection or mass lesion/mass effect. Vascular: Atherosclerotic calcification of the carotid siphons. No hyperdense vessel. Skull: No calvarial fracture or suspicious osseous lesion. No scalp swelling or hematoma. Sinuses/Orbits: Paranasal sinuses and mastoid air cells are predominantly  clear. Included orbital structures are unremarkable. Other: None IMPRESSION: Normal for age noncontrast head CT. Electronically Signed   By: Lovena Le M.D.   On: 09/03/2019 21:46   Mr Brain Wo Contrast  Result Date: 09/04/2019 CLINICAL DATA:  Seizure, new, nontraumatic. EXAM: MRI HEAD WITHOUT CONTRAST TECHNIQUE: Multiplanar, multiecho pulse sequences of the brain and surrounding structures were obtained without intravenous contrast. COMPARISON:  Head CT from yesterday FINDINGS: Brain: No cortical finding to explain seizure, including at the bilateral hippocampus. No acute infarction, hemorrhage, hydrocephalus, extra-axial collection or mass lesion. Mild patchy FLAIR hyperintensity in the cerebral white matter, usually chronic small vessel ischemia by the 6th decade. Normal brain volume. Vascular: Normal flow voids Skull and upper cervical spine: Negative for marrow lesion Sinuses/Orbits: Negative IMPRESSION: 1. No acute finding or explanation for seizure. 2. Mild nonspecific white matter disease. Electronically Signed   By: Monte Fantasia M.D.   On: 09/04/2019 05:56    Scheduled Meds: . cholecalciferol  6,000 Units Oral Daily  . enoxaparin (LOVENOX) injection  40 mg Subcutaneous Q24H  . multivitamin with minerals  1 tablet Oral Daily  . ondansetron  4 mg Oral Once  . pantoprazole  40 mg Oral Daily  . sodium chloride flush  3 mL Intravenous Q12H  . vitamin C  500 mg Oral Daily  . zinc sulfate  220 mg Oral Daily   Continuous Infusions:  Principal Problem:   Syncope Active Problems:  Elevated troponin   Seizure (HCC)   Acute kidney injury ([place])   Hypertension   GERD (gastroesophageal reflux disease)   Vertigo   Tachycardia    Time spent: 93 minutes    [place] NP  [facility]  If 7PM-7AM, please contact night-coverage at [email or web address], password [facility] 09/04/2019, 3:06 PM  LOS: 0 days

## 2019-09-04 NOTE — Discharge Instructions (Signed)
If aciphex is too expensive, keep your current PPI but be sure to take the magnesium   Non-Epileptic Seizures, Adult A seizure can cause:  Involuntary movements, like falling or shaking.  Changes in awareness or consciousness.  Convulsions. These are episodes of uncontrollable, jerking movement caused by sudden, intense tightening (contraction) of the muscles. Epileptic seizures are caused by abnormal electrical activity in the brain. Non-epileptic seizures are different. They are not caused by abnormal electrical signals in your brain. These seizures may look like epileptic seizures, but they are not caused by epilepsy. There are two types of non-epileptic seizures:  Physiologic non-epileptic seizure. This type results from an underlying problem that causes a disruption in your brains electrical activity.  Psychogenic non-epileptic seizure. This type results from emotional stress. These seizures are sometimes called pseudoseizures. What are the causes? Causes of physiologic non-epileptic seizures can include:  Sudden drop in blood pressure.  Low blood sugar (glucose).  Low levels of salt (sodium) in your blood.  Low levels of calcium in your blood.  Migraine.  Heart rhythm problems.  Sleep disorders, such as narcolepsy.  Movement disorders, such as Tourette syndrome.  Infection.  Certain medicines.  Drug and alcohol abuse.  Fever. Common causes of psychogenic non-epileptic seizures can include:  Stress.  Emotional trauma.  Sexual or physical abuse.  Major life events, such as divorce or death of a loved one.  Mental health disorders, including anxiety and depression. What are the signs or symptoms? Symptoms of a non-epileptic seizure can be similar to those of an epileptic seizure, which may include:  A change in attention or behavior (altered mental status).  Loss of consciousness or fainting.  Convulsions with rhythmic jerking  movements.  Drooling.  Rapid eye movements.  Grunting.  Loss of bladder control and bowel control.  Bitter taste in the mouth.  Tongue biting. Some people experience unusual sensations (aura) before having a seizure. These can include:  "Butterflies" in the stomach.  Abnormal smells or tastes.  A feeling of having had a new experience before (dj vu). After a non-epileptic seizure, you may have a headache or sore muscles or feel confused and sleepy. Non-epileptic seizures usually:  Do not cause physical injuries.  Start slowly.  Include crying or shrieking.  Last longer than 2 minutes.  Include pelvic thrusting. How is this diagnosed? Non-epileptic seizures may be diagnosed by:  Your medical history.  A physical exam.  Your symptoms. ? Your health care provider may want to talk with your friends or relatives who have seen you have a seizure. ? If possible, it is helpful if you write down your seizure activity, including what led up to the seizure, and share that information with your health care provider. You may also need to have tests to look for causes of physiologic non-epileptic seizures. These may include:  An electroencephalogram (EEG). This test measures electrical activity in your brain. If you have had a non-epileptic seizure, the results of your EEG will likely be normal.  Video EEG. This test takes place in the hospital over the course of 2-7 days. The test uses a video camera and an EEG to monitor your symptoms and the electrical activity in your brain.  Blood tests.  Lumbar puncture. This test involves pulling fluid from your spine to check for infection.  Electrocardiogram (ECG or EKG). This test checks for an abnormal heart rhythm.  CT scan. If your health care provider thinks you have had a psychogenic non-epileptic seizure, you may  need to see a mental health specialist for an evaluation. How is this treated? The treatment for your seizures  will depend on what is causing them. When the underlying condition is treated, your seizures should stop. If your seizures are being caused by emotional trauma or stress, your health care provider may recommend that you see a mental health professional. Treatment may include:  Relaxation therapy or cognitive behavioral therapy.  Medicines to treat depression or anxiety.  Individual or family counseling. In some cases, you may have psychogenic seizures in addition to epileptic seizures. If this is the case, you may be prescribed medicine to help with the epileptic seizures. Follow these instructions at home: Home care will depend on the type of non-epileptic seizures that you have. In general:  Follow all instructions from your health care provider. These may include ways to prevent seizures and what to do if you have a seizure.  Take over-the-counter and prescription medicines only as told by your health care provider.  Keep all follow-up visits as told by your health care provider. This is important.  Make sure family members, friends, and coworkers are trained on how to help you if you have a seizure. If you have a seizure, they should: ? Lay you on the ground to prevent a fall. ? Place a pillow or piece of clothing under your head. ? Loosen any clothing around your neck. ? Turn you onto your side. If vomiting occurs, this helps keep your airway clear.  Avoid any substances that may prevent your medicine from working properly. If you are prescribed medicine for seizures: ? Do not use recreational drugs. ? Limit or avoid alcoholic beverages. Contact a health care provider if:  Your seizures change or become more frequent.  You continue to have seizures after treatment. Get help right away if:  You injure yourself during a seizure.  You have one seizure after another.  You have trouble recovering from a seizure.  You have chest pain or trouble breathing.  You have a seizure  that lasts longer than 5 minutes. Summary  Non-epileptic seizures may look like epileptic seizures, but they are not caused by epilepsy.  The treatment for your seizures will depend on what is causing them. When the underlying condition is treated, your seizures should stop.  Make sure family members, friends, and coworkers are trained on how to help you if you have a seizure. If you have a seizure, they should lay you on the ground to prevent a fall, protect your head and neck, and turn you onto your side. This information is not intended to replace advice given to you by your health care provider. Make sure you discuss any questions you have with your health care provider. Document Released: 01/08/2006 Document Revised: 11/05/2017 Document Reviewed: 09/04/2016 Elsevier Patient Education  2020 [employer].

## 2019-09-04 NOTE — Progress Notes (Signed)
Discharge instructions given Vital signes stable, pt has no questions, pt ambulatory to exit pt insisted on walking out.

## 2019-09-04 NOTE — ED Notes (Signed)
Breakfast ordered 

## 2019-09-04 NOTE — Progress Notes (Signed)
EEG complete - results pending 

## 2019-09-04 NOTE — Consult Note (Signed)
Cardiology Consultation:   Patient ID: Barry Miles; [id number]; 08-Sep-1956   Admit date: 09/03/2019 Date of Consult: 09/04/2019  Primary Care Provider: Unk Pinto, MD Primary Cardiologist: No primary care provider on file. Primary Electrophysiologist:  None  Chief Complaint: abnormal troponin  Patient Profile:   Barry Miles is a 63 y.o. male with a hx of GERD who presents after an episode of syncope vs seizure.  History of Present Illness:   Patient was riding in the car with his wife this evening when he felt tunnel vision like he was going to pass out.  Grabbed the handle at the top of the window.  Wife states that he then became unresponsive with his eyes rolled back in his head.  Arms were up in the air and shaking somewhat.  Lost control of his bladder.  Lasted 3-4 minutes.  Afterward patient was a little confused but could respond to questions, sweaty, and nauseous.  Vomited once.  Came straight to the ED.  VS unremarkable other than HTN (144/96).  Labs Cr 1.4.  Hs Troponin 6 -> 51 -> 58.  ECG TWI in II, III, aVF, V3-V6 (has chronic TWI in V5-V6).  CT head unremarkable.  Patient denies chest pain/pressure today and currently has no symptoms and appears comfortable lying in bed.  Does not do structured exercise, but is fairly active around the house and denies a history of chest pain.  No smoking or diabetes.  Past Medical History:  Diagnosis Date   Arthritis    Arthritis    Carpal tunnel syndrome on both sides    Erectile dysfunction    GERD (gastroesophageal reflux disease)    Vertigo     Past Surgical History:  Procedure Laterality Date   COLONOSCOPY     POLYPECTOMY     TONSILLECTOMY     WISDOM TOOTH EXTRACTION       Inpatient Medications: Scheduled Meds:  aspirin  324 mg Oral Once   ondansetron  4 mg Oral Once   Continuous Infusions:  PRN Meds:   Home Meds: Prior to Admission medications   Medication Sig Start Date End Date  Taking? Authorizing Provider  CALCIUM CARBONATE PO Take 1,000 mg by mouth as needed.    [provider]  Cholecalciferol (VITAMIN D3) 2000 UNITS TABS Take 3 capsules by mouth 3 (three) times daily.    [provider]  ibuprofen (ADVIL,MOTRIN) 200 MG tablet Take 200 mg by mouth every 6 (six) hours as needed.    [provider]  Magnesium 400 MG CAPS Take 400 mg by mouth daily.    [provider]  meloxicam (MOBIC) 15 MG tablet 1 TAB DAILY WITH FOOD X 2 WEEKS THEN AS NEEDED DAILY FOR PAIN.NO ALEVE/IBUPROFEN, CAN TAKE TYLENOL 05/11/17   Unk Pinto, MD  Multiple Vitamins-Minerals (MULTIVITAMIN WITH MINERALS) tablet Take 1 tablet by mouth daily.    [provider]  omeprazole (PRILOSEC) 40 MG capsule Take 1 capsule (40 mg total) by mouth daily. 03/22/18   Vicie Mutters, PA-C  tadalafil (CIALIS) 20 MG tablet Take 1 tablet (20 mg total) by mouth daily as needed for erectile dysfunction. 05/25/19   Garnet Sierras, NP  vitamin C (ASCORBIC ACID) 500 MG tablet Take 500 mg by mouth daily.    [provider]    Allergies:   No Known Allergies  Social History:   Social History   Socioeconomic History   Marital status: Married    Spouse name: Not on file  Number of children: Not on file   Years of education: Not on file   Highest education level: Not on file  Occupational History   Not on file  Social Needs   Financial resource strain: Not on file   Food insecurity    Worry: Not on file    Inability: Not on file   Transportation needs    Medical: Not on file    Non-medical: Not on file  Tobacco Use   Smoking status: Never Smoker   Smokeless tobacco: Never Used  Substance and Sexual Activity   Alcohol use: No   Drug use: No   Sexual activity: Not on file  Lifestyle   Physical activity    Days per week: Not on file    Minutes per session: Not on file   Stress: Not on file  Relationships   Social connections     Talks on phone: Not on file    Gets together: Not on file    Attends religious service: Not on file    Active member of club or organization: Not on file    Attends meetings of clubs or organizations: Not on file    Relationship status: Not on file   Intimate partner violence    Fear of current or ex partner: Not on file    Emotionally abused: Not on file    Physically abused: Not on file    Forced sexual activity: Not on file  Other Topics Concern   Not on file  Social History Narrative   Not on file     Family History:    Family History  Problem Relation Age of Onset   Diabetes Mother    Breast cancer Mother    Diabetes Father    Heart disease Father    Colon polyps Maternal Uncle    Diabetes Maternal Grandmother    Diabetes Paternal Grandmother    Colon polyps Maternal Aunt    Colon cancer Neg Hx    Esophageal cancer Neg Hx    Stomach cancer Neg Hx    Rectal cancer Neg Hx    Pancreatic cancer Neg Hx       ROS:  Please see the history of present illness.  All other ROS reviewed and negative.     Physical Exam/Data:   Vitals:   09/03/19 2045 09/03/19 2100 09/03/19 2115 09/03/19 2130  BP: (!) 166/96 (!) 157/96 (!) 167/106 (!) 144/96  Pulse:  84 92 80  Resp: 18 19 16 18   Temp:      TempSrc:      SpO2:  96% 98% 97%  Weight:      Height:       No intake or output data in the 24 hours ending 09/04/19 0022 Last 3 Weights 09/03/2019 05/25/2019 10/03/2018  Weight (lbs) 225 lb 225 lb 222 lb  Weight (kg) 102.059 kg 102.059 kg 100.699 kg     Body mass index is 34.21 kg/m.  General: Well developed, well nourished, in no acute distress. Head: Normocephalic, atraumatic, sclera non-icteric, no xanthomas, nares are without discharge.  Neck: Negative for carotid bruits. JVD not elevated. Lungs: Clear bilaterally to auscultation without wheezes, rales, or rhonchi. Breathing is unlabored. Heart: RRR with S1 S2. No murmurs, rubs, or gallops  appreciated. Abdomen: Soft, non-tender, non-distended with normoactive bowel sounds. No hepatomegaly. No rebound/guarding. No obvious abdominal masses. Msk:  Strength and tone appear normal for age. Extremities: No clubbing or cyanosis. No edema.  Distal pedal pulses  are 2+ and equal bilaterally. Neuro: Alert and oriented X 3. No facial asymmetry. No focal deficit. Moves all extremities spontaneously. Psych:  Responds to questions appropriately with a normal affect.   EKG:  The EKG was personally reviewed and demonstrates:  Sinus rhythm with TWI in II, III, aVF, V3-V6 Telemetry:  Telemetry was personally reviewed and demonstrates:  Sinus rhythm  Relevant CV Studies: None  Laboratory Data:  High Sensitivity Troponin:   Recent Labs  Lab 09/03/19 1800 09/03/19 2042 09/03/19 2227  TROPONINIHS 6 51* 58*     Cardiac EnzymesNo results for input(s): TROPONINI in the last 168 hours. No results for input(s): TROPIPOC in the last 168 hours.  Chemistry Recent Labs  Lab 09/03/19 1800  NA 140  K 3.6  CL 108  CO2 21*  GLUCOSE 130*  BUN 17  CREATININE 1.38*  CALCIUM 9.0  GFRNONAA 55*  GFRAA >60  ANIONGAP 11    Recent Labs  Lab 09/03/19 1800  PROT 6.7  ALBUMIN 4.1  AST 34  ALT 37  ALKPHOS 63  BILITOT 0.6   Hematology Recent Labs  Lab 09/03/19 1800  WBC 6.8  RBC 4.99  HGB 15.5  HCT 46.0  MCV 92.2  MCH 31.1  MCHC 33.7  RDW 13.0  PLT 171   BNPNo results for input(s): BNP, PROBNP in the last 168 hours.  DDimer No results for input(s): DDIMER in the last 168 hours.   Radiology/Studies:  Dg Chest 2 View  Result Date: 09/03/2019 CLINICAL DATA:  Patient fainted.  Weakness and nausea. EXAM: CHEST - 2 VIEW COMPARISON:  December 17, 2011 FINDINGS: The heart size and mediastinal contours are within normal limits. Both lungs are clear. The visualized skeletal structures are unremarkable. IMPRESSION: No active cardiopulmonary disease. Electronically Signed   By: Dorise Bullion III M.D   On: 09/03/2019 18:50   Ct Head Wo Contrast  Result Date: 09/03/2019 CLINICAL DATA:  New seizure, nontraumatic EXAM: CT HEAD WITHOUT CONTRAST TECHNIQUE: Contiguous axial images were obtained from the base of the skull through the vertex without intravenous contrast. COMPARISON:  None. FINDINGS: Brain: No evidence of acute infarction, hemorrhage, hydrocephalus, extra-axial collection or mass lesion/mass effect. Vascular: Atherosclerotic calcification of the carotid siphons. No hyperdense vessel. Skull: No calvarial fracture or suspicious osseous lesion. No scalp swelling or hematoma. Sinuses/Orbits: Paranasal sinuses and mastoid air cells are predominantly clear. Included orbital structures are unremarkable. Other: None IMPRESSION: Normal for age noncontrast head CT. Electronically Signed   By: Lovena Le M.D.   On: 09/03/2019 21:46    Assessment and Plan:   1. Elevated troponin This episode sounds more like a seizure with his wife's description of his arm movements and loss of bladder control.  Agree with observation overnight and neurology evaluation.  Suspicion for ACS is low despite elevated troponin and mild ECG changes.  He has no chest pain/pressure at all and appears comfortable currently with no symptoms.  Does not have a lot of risk factors.  Troponin elevations have been found to occur following seizures Yaakov Guthrie, N., Allendrfer, Servando Snare et al. [facility] Neurol 12, 58 (2012).)   Would plan to get echo tomorrow to eval LV function.  If normal can likely follow up as an outpatient, with consideration for stress test at that time if indicated.     For questions or updates, please contact [place] Please consult [email or web address] for contact info under     Signed, Mayci Haning S, MD  09/04/2019 12:22 AM

## 2019-09-06 NOTE — Progress Notes (Signed)
Hospital follow up  Assessment and Plan: Hospital visit follow up for syncopal episode   Tarez was seen today for other and follow-up.  Diagnoses and all orders for this visit:  Syncope, unspecified syncope type No further episodes Discussed continuing to monitor Discussed adequate water intake Discussed S&S of stroke/TIA Hospital precautions  Gastroesophageal reflux disease, unspecified whether esophagitis present Doing well  Continue rabeprazole 20mg  daily, doing well on this Has D/C omeprazole  Seizure ([place]) EEG within normal limits Monitoring symptoms Safety precautions discussed related to driving and work Follow up with Neurology next week.  Acute kidney injury ([place]) Increase fluid intake Increase fluids  Avoid NSAIDS Blood pressure control Monitor sugars  Will continue to monitor  Essential hypertension Controlled today Discussed dietary and exercise modifications  Tachycardia Resolved  Hiatal hernia Incidental finding Discussed management Small meals, frequent  Cholelithiasis Noted on CT scan  Fatty Liver Noted on CT scan Discussed dietary and exercise modifications Will continue to monitor  Will check CMP & Mg in four days s/sp 1 week hospital discharge.  Has cardiology follow up in two months  All medications were reviewed with patient and family and fully reconciled. All questions answered fully, and patient and family members were encouraged to call the office with any further questions or concerns. Discussed goal to avoid readmission related to this diagnosis.  Omeprazole was discontinued.  CAN NOT DO FOR BCBS REGULAR OR MEDICARE Over 40 minutes of exam, counseling, chart review, and complex, high/moderate level critical decision making was performed this visit.   Future Appointments  Date Time Provider [place]  09/11/2019  9:00 AM [facility] None  09/12/2019  8:30 AM Garvin Fila, MD [facility] None   11/17/2019  1:40 PM O'Neal, Cassie Freer, MD [facility]  06/04/2020  9:00 AM Garnet Sierras, NP [facility] None     HPI 63 y.o.male presents for follow up for transition from recent hospitalization  Admit date to the hospital was 09/03/19, patient was discharged from the hospital on 09/04/19 and our clinical staff contacted the office the day after discharge to set up a follow up appointment. The discharge summary, medications, and diagnostic test results were reviewed before meeting with the patient. The patient was admitted for: Syncope that occurred suddenly while riding in the back of a car.  He felt dizzy and then his vision turned dark.  His wife was in the car with him and his sister-in-law was driving.  His wife reports that he was unresponsive for several minutes.  His eyes were opena d he was making noises.  She did not notices any shaking or drooling during the episode.  He was sweating profusly and his extremities felt cold.  While waiting to be seen in the ER he had an episode of emesis.  No stroke symptoms and no history of seizures, CAD or blood clots. He had elevated troponin, cardiac CT showed minimal disease.  He was given IV fluids to correct AKI.  Hypertension/tachycardia noted while in hospital and cardiology follow up recommended.  He has GERD and taking PPI, omeprazole which was changed to rabeprazole.  Reported during hospital admission he stopped taking his magnesium supplement.  Was recommended he resume this.  HIV screening was completed, negative. SARS-CoV-2 negative on 09/04/19. Neurology and cardiology were consulted during this admission.  Since discharge he has been doing well.  He denies any further episodes.  Denies any shortness of breath, palpitations, tachycardia, chest pains, headache, vision changes or blurry vision, dizziness,  tinnitus, difficulty swallowing food or coughing after liquid consumption.  Denies any numbness or tingling, weakness, falls or near  misses, nausea vomiting or abdominal pains.  He is accompanied with his wife today. She denies any cognitive changes in patient from baseline.  He has short term disability paperwork with him today.  Will complete this through date of neurological evaluation with Dr Leonie Man on 09/12/19.  Outcome of appointment further determines return to work.  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   Home health is not involved.   Also had EEG, normal limits, no seizures or epileptiform discharges were seen throughout the recording.  Images while in the hospital: Dg Chest 2 View  Result Date: 09/03/2019 CLINICAL DATA:  Patient fainted.  Weakness and nausea. EXAM: CHEST - 2 VIEW COMPARISON:  December 17, 2011 FINDINGS: The heart size and mediastinal contours are within normal limits. Both lungs are clear. The visualized skeletal structures are unremarkable. IMPRESSION: No active cardiopulmonary disease. Electronically Signed   By: Dorise Bullion III M.D   On: 09/03/2019 18:50   Ct Head Wo Contrast  Result Date: 09/03/2019 CLINICAL DATA:  New seizure, nontraumatic EXAM: CT HEAD WITHOUT CONTRAST TECHNIQUE: Contiguous axial images were obtained from the base of the skull through the vertex without intravenous contrast. COMPARISON:  None. FINDINGS: Brain: No evidence of acute infarction, hemorrhage, hydrocephalus, extra-axial collection or mass lesion/mass effect. Vascular: Atherosclerotic calcification of the carotid siphons. No hyperdense vessel. Skull: No calvarial fracture or suspicious osseous lesion. No scalp swelling or hematoma. Sinuses/Orbits: Paranasal sinuses and mastoid air cells are predominantly clear. Included orbital structures are unremarkable. Other: None IMPRESSION: Normal for age noncontrast head CT. Electronically Signed   By: Lovena Le M.D.   On: 09/03/2019 21:46   Mr Brain Wo Contrast  Result Date: 09/04/2019 CLINICAL DATA:   Seizure, new, nontraumatic. EXAM: MRI HEAD WITHOUT CONTRAST TECHNIQUE: Multiplanar, multiecho pulse sequences of the brain and surrounding structures were obtained without intravenous contrast. COMPARISON:  Head CT from yesterday FINDINGS: Brain: No cortical finding to explain seizure, including at the bilateral hippocampus. No acute infarction, hemorrhage, hydrocephalus, extra-axial collection or mass lesion. Mild patchy FLAIR hyperintensity in the cerebral white matter, usually chronic small vessel ischemia by the 6th decade. Normal brain volume. Vascular: Normal flow voids Skull and upper cervical spine: Negative for marrow lesion Sinuses/Orbits: Negative IMPRESSION: 1. No acute finding or explanation for seizure. 2. Mild nonspecific white matter disease. Electronically Signed   By: Monte Fantasia M.D.   On: 09/04/2019 05:56   Ct Coronary Morph W/cta Cor W/score W/ca W/cm &/or Wo/cm  Addendum Date: 09/04/2019   ADDENDUM REPORT: 09/04/2019 17:08 CLINICAL DATA:  Chest pain/Syncope EXAM: Cardiac/Coronary CTA TECHNIQUE: The patient was scanned on a [employer]. A 100 kV prospective scan was triggered in the descending thoracic aorta at 111 HU's. Axial non-contrast 3 mm slices were carried out through the heart. The data set was analyzed on a dedicated work station and scored using the [place]. Gantry rotation speed was 250 msecs and collimation was .6 mm. No beta blockade and 0.8 mg of sl NTG was given. The 3D data set was reconstructed in 5% intervals of the 35-75 % of the R-R cycle. Diastolic phases were analyzed on a dedicated work station using MPR, MIP and VRT modes. The patient received 80 cc of contrast. FINDINGS: Image quality: Average. Noise artifact is: Limited. Movement artifact noted.  Coronary Arteries:  Normal coronary origin.  Right dominance. Left main: The left main is a large caliber vessel with a normal take off from the left coronary cusp that bifurcates to form a left  anterior descending artery and a left circumflex artery. There is no plaque or stenosis. Left anterior descending artery: The proximal LAD contains minimal non-calcified plaque (<25%). 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 The mid and distal segments are free of plaque or stenosis. The LAD gives off 2 patent diagonal branches. Left circumflex artery: The LCX is non-dominant and patent with no evidence of plaque or stenosis. The LCX gives off 2 patent obtuse marginal branches. Right coronary artery: The RCA is dominant with normal take off from the right coronary cusp. There is a stair step artifact in the proximal to mid RCA that is likely normal. The RCA is free of plaque and stenosis. The RCA terminates as a PDA and right posterolateral branch without evidence of plaque or stenosis. Right Atrium: Right atrial size is within normal limits. Right Ventricle: The right ventricular cavity is within normal limits. Left Atrium: Left atrial size is normal in size with no left atrial appendage filling defect. Left Ventricle: The ventricular cavity size is within normal limits. There are no stigmata of prior infarction. There is no abnormal filling defect. Pulmonary arteries: Normal in size without proximal filling defect. Pulmonary veins: Normal pulmonary venous drainage. Pericardium: Normal thickness with no significant effusion or calcium present. Cardiac valves: The aortic valve is trileaflet without significant calcification. The mitral valve is normal structure without significant calcification. Aorta: Normal caliber with minimal calcified plaque. Extra-cardiac findings: See attached radiology report for non-cardiac structures. IMPRESSION: 1. Coronary calcium score of 0. 2. Normal coronary origin with right dominance. 3. Minimal non-calcified plaque in the pLAD (<25%) that is non-obstructive. Normal LCX. 4. Significant stairstep artifact noted from movement, but RCA appears to be normal without evidence  of plaque or stenosis. 5. 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 CAD-RADS 1: Minimal non-obstructive CAD (0-24%). Consider non-atherosclerotic causes of chest pain. Consider preventive therapy and risk factor modification. Eleonore Chiquito, MD Electronically Signed   By: Eleonore Chiquito   On: 09/04/2019 17:08   Result Date: 09/04/2019 EXAM: OVER-READ INTERPRETATION  CT CHEST The following report is an over-read performed by radiologist Dr. Rolm Baptise of [facility], [place] on 09/04/2019. This over-read does not include interpretation of cardiac or coronary anatomy or pathology. The coronary CTA interpretation by the cardiologist is attached. COMPARISON:  None. FINDINGS: Vascular: Heart is mildly enlarged. Aorta is normal caliber. Scattered aortic calcifications within the arch. Mediastinum/Nodes: Small hiatal hernia. No mediastinal, hilar, or axillary adenopathy. Trachea and esophagus are unremarkable. Lungs/Pleura: No confluent airspace opacities or effusions. Upper Abdomen: Multiple gallstones within the gallbladder. Suspect diffuse fatty infiltration of the liver. Musculoskeletal: Chest wall soft tissues are unremarkable. No acute bony abnormality. IMPRESSION: Small hiatal hernia. Cholelithiasis. Suspect fatty infiltration of the liver. No acute extra cardiac abnormality. Electronically Signed: By: Rolm Baptise M.D. On: 09/04/2019 16:40      Current Outpatient Medications (Cardiovascular):  .  tadalafil (CIALIS) 20 MG tablet, Take 1 tablet (20 mg total) by mouth daily as needed for erectile dysfunction.     Current Outpatient Medications (Other):  Marland Kitchen  Cholecalciferol (VITAMIN D3) 2000 UNITS TABS, Take 3 capsules by mouth daily.  .  Magnesium 250 MG TABS, Take 0.8 tablets (200 mg total) by mouth daily. Marland Kitchen  Multiple Vitamins-Minerals (MULTIVITAMIN WITH MINERALS) tablet, Take 1 tablet by mouth daily. .  RABEprazole (ACIPHEX) 20 MG tablet, Take 1 tablet  (20 mg total) by mouth daily. .  vitamin C (ASCORBIC ACID) 500 MG tablet, Take 500 mg by mouth daily. Marland Kitchen  zinc gluconate 50 MG tablet, Take 50 mg by mouth daily.  Past Medical History:  Diagnosis Date  . Arthritis   . Arthritis   . Carpal tunnel syndrome on both sides   . Erectile dysfunction   . GERD (gastroesophageal reflux disease)   . Seizure ([place])   . Vertigo      No Known Allergies  ROS: all negative except above.   Physical Exam: Filed Weights   09/07/19 1012  Weight: 228 lb (103.4 kg)   BP 128/86   Pulse 73   Temp 97.7 F (36.5 C)   Ht 5\' 8"  (1.727 m)   Wt 228 lb (103.4 kg)   SpO2 99%   BMI 34.67 kg/m  General Appearance: Well nourished, in no apparent distress. Eyes: PERRLA, EOMs, conjunctiva no swelling or erythema Sinuses: No Frontal/maxillary tenderness ENT/Mouth: Ext aud canals clear, TMs without erythema, bulging. No erythema, swelling, or exudate on post pharynx.  Tonsils not swollen or erythematous. Hearing normal.  Neck: Supple, thyroid normal.  Respiratory: Respiratory effort normal, BS equal bilaterally without rales, rhonchi, wheezing or stridor.  Cardio: RRR with no MRGs. Brisk peripheral pulses without edema.  Abdomen: Soft, + BS.  Non tender, no guarding, rebound, hernias, masses. Lymphatics: Non tender without lymphadenopathy.  Musculoskeletal: Full ROM, 5/5 strength, normal gait.  Skin: Warm, dry without rashes, lesions, ecchymosis.  Neuro: Cranial nerves intact. Normal muscle tone, no cerebellar symptoms. Sensation intact.  Psych: Awake and oriented X 3, normal affect, Insight and Judgment appropriate.     Garnet Sierras, NP 1:33 PM [facility]

## 2019-09-06 NOTE — Progress Notes (Deleted)
Hospital follow up  Assessment and Plan: Hospital visit follow up for ***:   All medications were reviewed with patient and family and fully reconciled. All questions answered fully, and patient and family members were encouraged to call the office with any further questions or concerns. Discussed goal to avoid readmission related to this diagnosis.  There are no discontinued medications.  Over 40 minutes of exam, counseling, chart review, and complex, high/moderate level critical decision making was performed this visit.   Future Appointments  Date Time Provider [place]  09/07/2019 10:15 AM Garnet Sierras, NP [facility] None  09/11/2019  9:00 AM Vicie Mutters, PA-C [facility] None  09/12/2019  8:30 AM Garvin Fila, MD [facility] None  11/17/2019  1:40 PM O'Neal, Cassie Freer, MD [facility]  06/04/2020  9:00 AM Garnet Sierras, NP [facility] None     HPI 63 y.o.male presents for follow up for transition from recent hospitalization or SNIF stay. Admit date to the hospital was 09/03/19, patient was discharged from the hospital on 09/04/19 and our clinical staff contacted the office the day after discharge to set up a follow up appointment. The discharge summary, medications, and diagnostic test results were reviewed before meeting with the patient. The patient was admitted for:    Home health {ACTION; IS/IS [id number] involved.   Images while in the hospital: Dg Chest 2 View  Result Date: 09/03/2019 CLINICAL DATA:  Patient fainted.  Weakness and nausea. EXAM: CHEST - 2 VIEW COMPARISON:  December 17, 2011 FINDINGS: The heart size and mediastinal contours are within normal limits. Both lungs are clear. The visualized skeletal structures are unremarkable. IMPRESSION: No active cardiopulmonary disease. Electronically Signed   By: Dorise Bullion III M.D   On: 09/03/2019 18:50   Ct Head Wo Contrast  Result Date: 09/03/2019 CLINICAL DATA:  New seizure, nontraumatic EXAM:  CT HEAD WITHOUT CONTRAST TECHNIQUE: Contiguous axial images were obtained from the base of the skull through the vertex without intravenous contrast. COMPARISON:  None. FINDINGS: Brain: No evidence of acute infarction, hemorrhage, hydrocephalus, extra-axial collection or mass lesion/mass effect. Vascular: Atherosclerotic calcification of the carotid siphons. No hyperdense vessel. Skull: No calvarial fracture or suspicious osseous lesion. No scalp swelling or hematoma. Sinuses/Orbits: Paranasal sinuses and mastoid air cells are predominantly clear. Included orbital structures are unremarkable. Other: None IMPRESSION: Normal for age noncontrast head CT. Electronically Signed   By: Lovena Le M.D.   On: 09/03/2019 21:46   Mr Brain Wo Contrast  Result Date: 09/04/2019 CLINICAL DATA:  Seizure, new, nontraumatic. EXAM: MRI HEAD WITHOUT CONTRAST TECHNIQUE: Multiplanar, multiecho pulse sequences of the brain and surrounding structures were obtained without intravenous contrast. COMPARISON:  Head CT from yesterday FINDINGS: Brain: No cortical finding to explain seizure, including at the bilateral hippocampus. No acute infarction, hemorrhage, hydrocephalus, extra-axial collection or mass lesion. Mild patchy FLAIR hyperintensity in the cerebral white matter, usually chronic small vessel ischemia by the 6th decade. Normal brain volume. Vascular: Normal flow voids Skull and upper cervical spine: Negative for marrow lesion Sinuses/Orbits: Negative IMPRESSION: 1. No acute finding or explanation for seizure. 2. Mild nonspecific white matter disease. Electronically Signed   By: Monte Fantasia M.D.   On: 09/04/2019 05:56   Ct Coronary Morph W/cta Cor W/score W/ca W/cm &/or Wo/cm  Addendum Date: 09/04/2019   ADDENDUM REPORT: 09/04/2019 17:08 CLINICAL DATA:  Chest pain/Syncope EXAM: Cardiac/Coronary CTA TECHNIQUE: The patient was scanned on a [employer]. A 100 kV prospective scan was triggered in the descending  thoracic aorta at 111 HU's. Axial non-contrast 3 mm slices were carried out through the heart. The data set was analyzed on a dedicated work station and scored using the [place]. Gantry rotation speed was 250 msecs and collimation was .6 mm. No beta blockade and 0.8 mg of sl NTG was given. The 3D data set was reconstructed in 5% intervals of the 35-75 % of the R-R cycle. Diastolic phases were analyzed on a dedicated work station using MPR, MIP and VRT modes. The patient received 80 cc of contrast. FINDINGS: Image quality: Average. Noise artifact is: Limited. Movement artifact noted. Coronary Arteries:  Normal coronary origin.  Right dominance. Left main: The left main is a large caliber vessel with a normal take off from the left coronary cusp that bifurcates to form a left anterior descending artery and a left circumflex artery. There is no plaque or stenosis. Left anterior descending artery: The proximal LAD contains minimal non-calcified plaque (<25%). 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 The mid and distal segments are free of plaque or stenosis. The LAD gives off 2 patent diagonal branches. Left circumflex artery: The LCX is non-dominant and patent with no evidence of plaque or stenosis. The LCX gives off 2 patent obtuse marginal branches. Right coronary artery: The RCA is dominant with normal take off from the right coronary cusp. There is a stair step artifact in the proximal to mid RCA that is likely normal. The RCA is free of plaque and stenosis. The RCA terminates as a PDA and right posterolateral branch without evidence of plaque or stenosis. Right Atrium: Right atrial size is within normal limits. Right Ventricle: The right ventricular cavity is within normal limits. Left Atrium: Left atrial size is normal in size with no left atrial appendage filling defect. Left Ventricle: The ventricular cavity size is within normal limits. There are no stigmata of prior infarction. There is no  abnormal filling defect. Pulmonary arteries: Normal in size without proximal filling defect. Pulmonary veins: Normal pulmonary venous drainage. Pericardium: Normal thickness with no significant effusion or calcium present. Cardiac valves: The aortic valve is trileaflet without significant calcification. The mitral valve is normal structure without significant calcification. Aorta: Normal caliber with minimal calcified plaque. Extra-cardiac findings: See attached radiology report for non-cardiac structures. IMPRESSION: 1. Coronary calcium score of 0. 2. Normal coronary origin with right dominance. 3. Minimal non-calcified plaque in the pLAD (<25%) that is non-obstructive. Normal LCX. 4. Significant stairstep artifact noted from movement, but RCA appears to be normal without evidence of plaque or stenosis. 5. 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 CAD-RADS 1: Minimal non-obstructive CAD (0-24%). Consider non-atherosclerotic causes of chest pain. Consider preventive therapy and risk factor modification. Eleonore Chiquito, MD Electronically Signed   By: Eleonore Chiquito   On: 09/04/2019 17:08   Result Date: 09/04/2019 EXAM: OVER-READ INTERPRETATION  CT CHEST The following report is an over-read performed by radiologist Dr. Rolm Baptise of [facility], [place] on 09/04/2019. This over-read does not include interpretation of cardiac or coronary anatomy or pathology. The coronary CTA interpretation by the cardiologist is attached. COMPARISON:  None. FINDINGS: Vascular: Heart is mildly enlarged. Aorta is normal caliber. Scattered aortic calcifications within the arch. Mediastinum/Nodes: Small hiatal hernia. No mediastinal, hilar, or axillary adenopathy. Trachea and esophagus are unremarkable. Lungs/Pleura: No confluent airspace opacities or effusions. Upper Abdomen: Multiple gallstones within the gallbladder. Suspect diffuse fatty infiltration of the liver. Musculoskeletal: Chest  wall soft tissues  are unremarkable. No acute bony abnormality. IMPRESSION: Small hiatal hernia. Cholelithiasis. Suspect fatty infiltration of the liver. No acute extra cardiac abnormality. Electronically Signed: By: Rolm Baptise M.D. On: 09/04/2019 16:40      Current Outpatient Medications (Cardiovascular):  .  tadalafil (CIALIS) 20 MG tablet, Take 1 tablet (20 mg total) by mouth daily as needed for erectile dysfunction.     Current Outpatient Medications (Other):  Marland Kitchen  Cholecalciferol (VITAMIN D3) 2000 UNITS TABS, Take 3 capsules by mouth daily.  .  Magnesium 250 MG TABS, Take 0.8 tablets (200 mg total) by mouth daily. .  Multiple Vitamins-Minerals (MULTIVITAMIN WITH MINERALS) tablet, Take 1 tablet by mouth daily. .  RABEprazole (ACIPHEX) 20 MG tablet, Take 1 tablet (20 mg total) by mouth daily. .  vitamin C (ASCORBIC ACID) 500 MG tablet, Take 500 mg by mouth daily. Marland Kitchen  zinc gluconate 50 MG tablet, Take 50 mg by mouth daily.  Past Medical History:  Diagnosis Date  . Arthritis   . Arthritis   . Carpal tunnel syndrome on both sides   . Erectile dysfunction   . GERD (gastroesophageal reflux disease)   . Seizure ([place])   . Vertigo      No Known Allergies  ROS: all negative except above.   Physical Exam: There were no vitals filed for this visit. There were no vitals taken for this visit. General Appearance: Well nourished, in no apparent distress. Eyes: PERRLA, EOMs, conjunctiva no swelling or erythema Sinuses: No Frontal/maxillary tenderness ENT/Mouth: Ext aud canals clear, TMs without erythema, bulging. No erythema, swelling, or exudate on post pharynx.  Tonsils not swollen or erythematous. Hearing normal.  Neck: Supple, thyroid normal.  Respiratory: Respiratory effort normal, BS equal bilaterally without rales, rhonchi, wheezing or stridor.  Cardio: RRR with no MRGs. Brisk peripheral pulses without edema.  Abdomen: Soft, + BS.  Non tender, no guarding, rebound, hernias,  masses. Lymphatics: Non tender without lymphadenopathy.  Musculoskeletal: Full ROM, 5/5 strength, normal gait.  Skin: Warm, dry without rashes, lesions, ecchymosis.  Neuro: Cranial nerves intact. Normal muscle tone, no cerebellar symptoms. Sensation intact.  Psych: Awake and oriented X 3, normal affect, Insight and Judgment appropriate.     Vicie Mutters, PA-C 12:52 PM [facility]

## 2019-09-07 ENCOUNTER — Telehealth: Payer: Self-pay | Admitting: *Deleted

## 2019-09-07 ENCOUNTER — Ambulatory Visit (INDEPENDENT_AMBULATORY_CARE_PROVIDER_SITE_OTHER): Payer: 59 | Admitting: Adult Health Nurse Practitioner

## 2019-09-07 ENCOUNTER — Other Ambulatory Visit: Payer: Self-pay

## 2019-09-07 ENCOUNTER — Encounter: Payer: Self-pay | Admitting: Adult Health Nurse Practitioner

## 2019-09-07 VITALS — BP 128/86 | HR 73 | Temp 97.7°F | Ht 68.0 in | Wt 228.0 lb

## 2019-09-07 DIAGNOSIS — R000 Tachycardia, unspecified: Secondary | ICD-10-CM

## 2019-09-07 DIAGNOSIS — K219 Gastro-esophageal reflux disease without esophagitis: Secondary | ICD-10-CM | POA: Diagnosis not present

## 2019-09-07 DIAGNOSIS — R569 Unspecified convulsions: Secondary | ICD-10-CM | POA: Diagnosis not present

## 2019-09-07 DIAGNOSIS — I10 Essential (primary) hypertension: Secondary | ICD-10-CM

## 2019-09-07 DIAGNOSIS — R55 Syncope and collapse: Secondary | ICD-10-CM | POA: Diagnosis not present

## 2019-09-07 DIAGNOSIS — N179 Acute kidney failure, unspecified: Secondary | ICD-10-CM

## 2019-09-07 DIAGNOSIS — K449 Diaphragmatic hernia without obstruction or gangrene: Secondary | ICD-10-CM

## 2019-09-07 DIAGNOSIS — K8020 Calculus of gallbladder without cholecystitis without obstruction: Secondary | ICD-10-CM

## 2019-09-07 DIAGNOSIS — K760 Fatty (change of) liver, not elsewhere classified: Secondary | ICD-10-CM

## 2019-09-07 NOTE — Patient Instructions (Addendum)
Be sure to drink 60-80 oz of water a day!  This will prevent dehydration and keep your kidneys filtering out toxins and waste.  Monitor for any new or worsening symptoms and contact our office.  We will fax in your disability paperwork.  Return on Monday for lab only appointment.  Follow up with Neurology.  Cardiology follow up in December.    GENERAL HEALTH GOALS  Know what a healthy weight is for you (roughly BMI <25) and aim to maintain this  Aim for 7+ servings of fruits and vegetables daily  70-80+ fluid ounces of water or unsweet tea for healthy kidneys  Limit to max 1 drink of alcohol per day; avoid smoking/tobacco  Limit animal fats in diet for cholesterol and heart health - choose grass fed whenever available  Avoid highly processed foods, and foods high in saturated/trans fats  Aim for low stress - take time to unwind and care for your mental health  Aim for 150 min of moderate intensity exercise weekly for heart health, and weights twice weekly for bone health  Aim for 7-9 hours of sleep daily

## 2019-09-07 NOTE — Telephone Encounter (Signed)
Called patient on 09/07/2019 , 8:45 AM in an attempt to reach the patient for a hospital follow up.   Admit date: 09/03/19 Discharge: 09/04/19   He does not have any questions or concerns about medications from the hospital admission. The patient's medications were reviewed over the phone, they were counseled to bring in all current medications to the hospital follow up visit.   I advised the patient to call if any questions or concerns arise about the hospital admission or medications    Home health was not started in the hospital.  All questions were answered and a follow up appointment was made. The patient will have an office visit 0/12/2018 to review hospital visit and fill out disability forms.  Prior to Admission medications   Medication Sig Start Date End Date Taking? Authorizing Provider  Cholecalciferol (VITAMIN D3) 2000 UNITS TABS Take 3 capsules by mouth daily.     [provider]  Magnesium 250 MG TABS Take 0.8 tablets (200 mg total) by mouth daily. 09/04/19   Geradine Girt, DO  Multiple Vitamins-Minerals (MULTIVITAMIN WITH MINERALS) tablet Take 1 tablet by mouth daily.    [provider]  RABEprazole (ACIPHEX) 20 MG tablet Take 1 tablet (20 mg total) by mouth daily. 09/04/19 09/03/20  Geradine Girt, DO  tadalafil (CIALIS) 20 MG tablet Take 1 tablet (20 mg total) by mouth daily as needed for erectile dysfunction. 05/25/19   Garnet Sierras, NP  vitamin C (ASCORBIC ACID) 500 MG tablet Take 500 mg by mouth daily.    [provider]  zinc gluconate 50 MG tablet Take 50 mg by mouth daily.    [provider]

## 2019-09-11 ENCOUNTER — Other Ambulatory Visit: Payer: 59

## 2019-09-11 ENCOUNTER — Ambulatory Visit: Payer: 59 | Admitting: Physician Assistant

## 2019-09-11 ENCOUNTER — Other Ambulatory Visit: Payer: Self-pay

## 2019-09-11 DIAGNOSIS — R569 Unspecified convulsions: Secondary | ICD-10-CM

## 2019-09-11 DIAGNOSIS — R55 Syncope and collapse: Secondary | ICD-10-CM

## 2019-09-12 ENCOUNTER — Telehealth: Payer: Self-pay | Admitting: Neurology

## 2019-09-12 ENCOUNTER — Ambulatory Visit (INDEPENDENT_AMBULATORY_CARE_PROVIDER_SITE_OTHER): Payer: Managed Care, Other (non HMO) | Admitting: Neurology

## 2019-09-12 ENCOUNTER — Encounter: Payer: Self-pay | Admitting: Neurology

## 2019-09-12 VITALS — BP 149/90 | HR 83 | Temp 97.7°F | Ht 68.0 in | Wt 230.0 lb

## 2019-09-12 DIAGNOSIS — R569 Unspecified convulsions: Secondary | ICD-10-CM

## 2019-09-12 DIAGNOSIS — R55 Syncope and collapse: Secondary | ICD-10-CM

## 2019-09-12 LAB — COMPLETE METABOLIC PANEL WITH GFR
AG Ratio: 1.8 (calc) (ref 1.0–2.5)
ALT: 25 U/L (ref 9–46)
AST: 18 U/L (ref 10–35)
Albumin: 4.3 g/dL (ref 3.6–5.1)
Alkaline phosphatase (APISO): 68 U/L (ref 35–144)
BUN: 16 mg/dL (ref 7–25)
CO2: 26 mmol/L (ref 20–32)
Calcium: 9.2 mg/dL (ref 8.6–10.3)
Chloride: 106 mmol/L (ref 98–110)
Creat: 1.18 mg/dL (ref 0.70–1.25)
GFR, Est African American: 77 mL/min/{1.73_m2} (ref >=60)
GFR, Est Non African American: 66 mL/min/{1.73_m2} (ref >=60)
Globulin: 2.4 g/dL (calc) (ref 1.9–3.7)
Glucose, Bld: 93 mg/dL (ref 65–99)
Potassium: 4.1 mmol/L (ref 3.5–5.3)
Sodium: 141 mmol/L (ref 135–146)
Total Bilirubin: 0.6 mg/dL (ref 0.2–1.2)
Total Protein: 6.7 g/dL (ref 6.1–8.1)

## 2019-09-12 LAB — CBC WITH DIFFERENTIAL/PLATELET
Absolute Monocytes: 394 cells/uL (ref 200–950)
Basophils Absolute: 29 cells/uL (ref 0–200)
Basophils Relative: 0.6 %
Eosinophils Absolute: 82 cells/uL (ref 15–500)
Eosinophils Relative: 1.7 %
HCT: 45.5 % (ref 38.5–50.0)
Hemoglobin: 15.5 g/dL (ref 13.2–17.1)
Lymphs Abs: 1214 cells/uL (ref 850–3900)
MCH: 30.9 pg (ref 27.0–33.0)
MCHC: 34.1 g/dL (ref 32.0–36.0)
MCV: 90.6 fL (ref 80.0–100.0)
MPV: 9.6 fL (ref 7.5–12.5)
Monocytes Relative: 8.2 %
Neutro Abs: 3082 cells/uL (ref 1500–7800)
Neutrophils Relative %: 64.2 %
Platelets: 170 10*3/uL (ref 140–400)
RBC: 5.02 10*6/uL (ref 4.20–5.80)
RDW: 13.1 % (ref 11.0–15.0)
Total Lymphocyte: 25.3 %
WBC: 4.8 10*3/uL (ref 3.8–10.8)

## 2019-09-12 LAB — MAGNESIUM: Magnesium: 2.1 mg/dL (ref 1.5–2.5)

## 2019-09-12 NOTE — Patient Instructions (Signed)
I had a long discussion with the patient and his wife regarding his brief episode of screaming altered consciousness with trembling and incontinence likely representing complex partial seizure with a prolonged syncopal event is also possible though less likely.  I recommend further evaluation by checking EEG as well as 30-day heart monitor.  I have advised the patient not to drive as per [facility].  We also discussed possible trial of anticonvulsants but the risk of recurrence with normal EEG and MRI scan is only 50% over his lifetime.  He will think about this and let me know.  I have advised him to avoid seizure provoking factors like irregular eating and sleeping habits, sleep deprivation, alcohol, stimulants and extremes of activities.  He will return for follow-up in 3 months or call earlier if necessary

## 2019-09-12 NOTE — Telephone Encounter (Signed)
Information on letter he needs   - addressed to Company name is [employer] addressed to  Tonette Lederer stating he will be out of work for 6 weeks pending further testing - fax: [phone] - office number  [phone]

## 2019-09-12 NOTE — Telephone Encounter (Signed)
Letter to be excused from work for 6 weeks pending test and results fax to [phone]. Letter fax twice and confirmed.

## 2019-09-12 NOTE — Progress Notes (Signed)
[facility] [address] [place]. [place] [address] [phone]       OFFICE CONSULT NOTE  Mr. Barry Miles Date of Birth:  1956/11/14 Medical Record Number:  [id number]   Referring MD: Eulogio Bear Reason for Referral: Syncope versus seizure HPI: Barry Miles is a 63 year old pleasant Caucasian male seen today for initial office consultation visit.  He is accompanied by his wife.  History is obtained from them, review of electronic medical records and have personally reviewed imaging films in PACS.  Barry Miles is a 63 year old male with past medical history significant for arthritis, gastroesophageal reflux disease, vertigo who [facility] for evaluation for episode of brief loss of consciousness.  Patient states that he had a usual day and he and his wife and gone to the house warming at a friend's house.  His sister-in-law was driving and he was sitting in the backseat and around 5 PM he remembers feeling dizzy and his vision becoming dark.  He denied any chest pain, shortness of breath or palpitations.  His wife happened to look back and noticed that his head was tilted back and he was unresponsive and staring ahead.  He did not respond to his wife's eyes stayed open he started making some noises and she noticed that both his arms were moving up and jerking briefly.  He finally responded and was confused and disoriented and did not know what happened.  He had urinary incontinence during this episode.  He did not have any tongue bite or tonic-clonic activity.  He did feel weak all over but was able to walk on his own to the emergency room.  He was noticed to have sweated profusely in all extremities but his vital signs and blood pressure upon arrival were apparently stable.  His EKG showed no acute changes.  Chest x-ray was negative for any acute disease.  He had a cardiac CT angiogram which showed that he was at low risk for cardiac events.   An outpatient 30-day heart monitor was recommended but never got done.  EEG was normal.  MRI scan of the brain showed no abnormalities.  Transthoracic echo was unremarkable.  Patient states is done well since then and had no further episodes.  He has no previous episodes of passing out or losing consciousness seizure or significant head injury with loss of consciousness.  He has had possible a few presyncopal events but he was able to sit down and have the feeling passes.  There is no family strip seizures or syncope.  He has no complaints today  ROS:   14 system review of systems is positive for passing out, unresponsiveness, glassy eye, jerking, incontinence and all other systems negative  PMH:  Past Medical History:  Diagnosis Date  . Arthritis   . Arthritis   . Carpal tunnel syndrome on both sides   . Erectile dysfunction   . GERD (gastroesophageal reflux disease)   . Seizure ([place])   . Vertigo     Social History:  Social History   Socioeconomic History  . Marital status: Married    Spouse name: Not on file  . Number of children: Not on file  . Years of education: Not on file  . Highest education level: Not on file  Occupational History  . Not on file  Social Needs  . Financial resource strain: Not on file  . Food insecurity    Worry: Not on file    Inability: Not  on file  . Transportation needs    Medical: Not on file    Non-medical: Not on file  Tobacco Use  . Smoking status: Never Smoker  . Smokeless tobacco: Never Used  Substance and Sexual Activity  . Alcohol use: No  . Drug use: No  . Sexual activity: Not on file  Lifestyle  . Physical activity    Days per week: Not on file    Minutes per session: Not on file  . Stress: Not on file  Relationships  . Social Herbalist on phone: Not on file    Gets together: Not on file    Attends religious service: Not on file    Active member of club or organization: Not on file    Attends meetings of clubs or  organizations: Not on file    Relationship status: Not on file  . Intimate partner violence    Fear of current or ex partner: Not on file    Emotionally abused: Not on file    Physically abused: Not on file    Forced sexual activity: Not on file  Other Topics Concern  . Not on file  Social History Narrative  . Not on file    Medications:   Current Outpatient Medications on File Prior to Visit  Medication Sig Dispense Refill  . Cholecalciferol (VITAMIN D3) 2000 UNITS TABS Take 3 capsules by mouth daily.     . Magnesium 250 MG TABS Take 0.8 tablets (200 mg total) by mouth daily. 30 tablet 0  . Multiple Vitamins-Minerals (MULTIVITAMIN WITH MINERALS) tablet Take 1 tablet by mouth daily.    . RABEprazole (ACIPHEX) 20 MG tablet Take 1 tablet (20 mg total) by mouth daily. 30 tablet 1  . tadalafil (CIALIS) 20 MG tablet Take 1 tablet (20 mg total) by mouth daily as needed for erectile dysfunction. 10 tablet 2  . vitamin C (ASCORBIC ACID) 500 MG tablet Take 500 mg by mouth daily.     No current facility-administered medications on file prior to visit.     Allergies:  No Known Allergies  Physical Exam General: Mildly obese middle-aged Caucasian male, seated, in no evident distress Head: head normocephalic and atraumatic.   Neck: supple with no carotid or supraclavicular bruits Cardiovascular: regular rate and rhythm, no murmurs Musculoskeletal: no deformity Skin:  no rash/petichiae Vascular:  Normal pulses all extremities  Neurologic Exam Mental Status: Awake and fully alert. Oriented to place and time. Recent and remote memory intact. Attention span, concentration and fund of knowledge appropriate. Mood and affect appropriate.  Cranial Nerves: Fundoscopic exam reveals sharp disc margins. Pupils equal, briskly reactive to light. Extraocular movements full without nystagmus. Visual fields full to confrontation. Hearing intact. Facial sensation intact. Face, tongue, palate moves normally  and symmetrically.  Motor: Normal bulk and tone. Normal strength in all tested extremity muscles. Sensory.: intact to touch , pinprick , position and vibratory sensation.  Coordination: Rapid alternating movements normal in all extremities. Finger-to-nose and heel-to-shin performed accurately bilaterally. Gait and Station: Arises from chair without difficulty. Stance is normal. Gait demonstrates normal stride length and balance . Able to heel, toe and tandem walk without difficulty.  Reflexes: 1+ and symmetric. Toes downgoing.       ASSESSMENT: 63 year old Caucasian male with single episode of unprovoked brief altered consciousness with staring, arms jerking and incontinence possibly complex partial seizure though prolonged  convulsive syncope is also possible though less likely    PLAN: I had a  long discussion with the patient and his wife regarding his brief episode of screaming altered consciousness with trembling and incontinence likely representing complex partial seizure with a prolonged syncopal event is also possible though less likely.  I recommend further evaluation by checking EEG as well as 30-day heart monitor.  I have advised the patient not to drive as per [facility].  We also discussed possible trial of anticonvulsants but the risk of recurrence with normal EEG and MRI scan is only 50% over his lifetime.  He will think about this and let me know.  I have advised him to avoid seizure provoking factors like irregular eating and sleeping habits, sleep deprivation, alcohol, stimulants and extremes of activities.  Greater than 50% time during this 45-minute consultation visit was spent on counseling and coordination of care about his episode of seizure versus syncope and answering questions.  He will return for follow-up in 3 months or call earlier if necessary Antony Contras, MD  09/12/2019 5:24 PM  Note: This document was prepared with digital dictation and possible smart phrase  technology. Any transcriptional errors that result from this process are unintentional.

## 2019-09-18 ENCOUNTER — Telehealth: Payer: Self-pay | Admitting: *Deleted

## 2019-09-18 NOTE — Telephone Encounter (Signed)
Pt called, need to know when will he be schedule for his heart monitor. Please call [phone]

## 2019-09-18 NOTE — Telephone Encounter (Signed)
I called pt and gave him the number for cardiac heart group of [phone]. I stated the order was sent to [facility] and they will call him to set up for the device. I stated it can take a couple of days for them to get the device. I advise pt that their office does all of the monitoring and setting up the device.THe pt verbalized understanding and was given their number to call.

## 2019-09-22 ENCOUNTER — Telehealth: Payer: Self-pay | Admitting: *Deleted

## 2019-09-22 NOTE — Telephone Encounter (Signed)
New message   Patient is calling to see when his heart monitor will be sent out. He has not received the heart monitor. Please call.

## 2019-09-25 NOTE — Telephone Encounter (Signed)
Apologized to patient for delay in receiving monitor.  Request never sent to monitor department. [employer] to ship a 30 day cardiac event monitor to the patients home.  Instructions reviewed briefly as they are included in the monitor kit.

## 2019-09-27 ENCOUNTER — Ambulatory Visit (INDEPENDENT_AMBULATORY_CARE_PROVIDER_SITE_OTHER): Payer: Managed Care, Other (non HMO) | Admitting: Neurology

## 2019-09-27 ENCOUNTER — Other Ambulatory Visit: Payer: Self-pay

## 2019-09-27 DIAGNOSIS — R569 Unspecified convulsions: Secondary | ICD-10-CM

## 2019-09-27 DIAGNOSIS — R55 Syncope and collapse: Secondary | ICD-10-CM

## 2019-10-02 ENCOUNTER — Telehealth: Payer: Self-pay | Admitting: Cardiovascular Disease

## 2019-10-02 NOTE — Telephone Encounter (Signed)
New message:     Patient calling stating that he would like for you to call. Please call patient.

## 2019-10-02 NOTE — Telephone Encounter (Signed)
[employer] called.  Per [employer] tracking, monitor scheduled to arrive Tuesday, 10/03/2019, late in the day.  Tracking # [id number].

## 2019-10-04 ENCOUNTER — Ambulatory Visit (INDEPENDENT_AMBULATORY_CARE_PROVIDER_SITE_OTHER): Payer: 59

## 2019-10-04 DIAGNOSIS — R55 Syncope and collapse: Secondary | ICD-10-CM | POA: Diagnosis not present

## 2019-10-23 ENCOUNTER — Telehealth: Payer: Self-pay | Admitting: Neurology

## 2019-10-23 NOTE — Telephone Encounter (Signed)
Pt has called to inform RN Katrina that he would like the letter to be faxed to [employer] @ [phone]. Pt states the needed letter needs to have his return to work date as 11-06-19

## 2019-10-23 NOTE — Telephone Encounter (Signed)
Sent to [place].

## 2019-10-23 NOTE — Telephone Encounter (Signed)
Ok to do letter stating so but cannot drive

## 2019-10-24 ENCOUNTER — Other Ambulatory Visit: Payer: Self-pay

## 2019-10-24 MED ORDER — RABEPRAZOLE SODIUM 20 MG PO TBEC: 20.0000 mg | DELAYED_RELEASE_TABLET | Freq: Every day | ORAL | 1 refills | Status: DC

## 2019-10-24 MED ORDER — MAGNESIUM 250 MG PO TABS: 200.0000 mg | ORAL_TABLET | Freq: Every day | ORAL | 1 refills | Status: AC

## 2019-10-24 NOTE — Telephone Encounter (Signed)
I call pt about his letter for work. Pt stated he will be returning 11/06/2019 full time. He states the letter has to stated return date, and he is 100 percent to work. Also that he cannot drive for 6 months according to Gas law.

## 2019-10-25 NOTE — Telephone Encounter (Signed)
Both fax numbers are phone numbers. Unable to fax to [phone] and [phone].Marland Kitchen Fax numbers failed. Work letter was put in mail for pt.

## 2019-10-25 NOTE — Telephone Encounter (Signed)
I tried to fax work letter to [phone] and [phone]. I tried to fax twice to both numbers. Both fax numbers were busy. Will try this afternoon. Letter will be put in mail for pt until I can fax. Pt was sent mychart message of this issue.

## 2019-11-14 ENCOUNTER — Ambulatory Visit: Payer: 59 | Admitting: Cardiovascular Disease

## 2019-11-16 NOTE — Progress Notes (Signed)
Cardiology Office Note:   Date:  11/17/2019  NAME:  Barry Miles    MRN: [id number] DOB:  20-Aug-1956   PCP:  Unk Pinto, MD  Cardiologist:  No primary care provider on file.  Electrophysiologist:  None   Referring MD: Unk Pinto, MD   Chief Complaint  Patient presents with  . Loss of Consciousness    History of Present Illness:   Barry Miles is a 63 y.o. male with a hx of vertigo, arthritis who is being seen today for the evaluation of hospital follow-up at the request of Unk Pinto, MD.  [name] was seen in the hospital in September after a syncopal event.  He had no prodromal symptoms such as chest pain or trouble breathing.  Cardiac enzymes were mildly elevated.  Cardiac CTA demonstrated minimal plaque in the proximal LAD.  No evidence of obstructive CAD.  30-day event monitor without any significant arrhythmia.  He was evaluated by neurology on 09/12/2019 who believed he had a seizure.  He reports he been doing well since being hospitalized.  He has had no further episodes of syncope.  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  He has no limitations doing this.  He reports he has goals to exercise and lose weight but has not started it a structured regimen quite yet.  He reports that he is a little uneasy about his diagnosis of seizure disorder.  He still not driving per [facility].  He reports that he has not received a firm diagnosis or reason for this.  This is frustrating to him.  I did review his echocardiogram as well as cardiac CTA.  He was shown to have minimal plaque in the LAD.  Review of laboratory data demonstrates an LDL of 105.  He is not diabetic.  I did discuss with him that we would like to get this a little bit lower and aspirin is not a bad idea.  Past Medical History: Past Medical History:  Diagnosis Date  . Arthritis   . Arthritis   . Carpal tunnel syndrome on both sides    . Erectile dysfunction   . GERD (gastroesophageal reflux disease)   . Seizure ([place])   . Vertigo     Past Surgical History: Past Surgical History:  Procedure Laterality Date  . COLONOSCOPY    . POLYPECTOMY    . TONSILLECTOMY    . WISDOM TOOTH EXTRACTION      Current Medications: Current Meds  Medication Sig  . Cholecalciferol (VITAMIN D3) 2000 UNITS TABS Take 3 capsules by mouth daily.   . Magnesium 250 MG TABS Take 0.8 tablets (200 mg total) by mouth daily.  . Multiple Vitamins-Minerals (MULTIVITAMIN WITH MINERALS) tablet Take 1 tablet by mouth daily.  . RABEprazole (ACIPHEX) 20 MG tablet Take 1 tablet (20 mg total) by mouth daily.  . tadalafil (CIALIS) 20 MG tablet Take 1 tablet (20 mg total) by mouth daily as needed for erectile dysfunction.  . vitamin C (ASCORBIC ACID) 500 MG tablet Take 500 mg by mouth daily.     Allergies:    Patient has no known allergies.   Social History: Social History   Socioeconomic History  . Marital status: Married    Spouse name: Not on file  . Number of children: Not on file  . Years of education: Not on file  . Highest education level: Not on file  Occupational History  . Not  on file  Tobacco Use  . Smoking status: Never Smoker  . Smokeless tobacco: Never Used  Substance and Sexual Activity  . Alcohol use: No  . Drug use: No  . Sexual activity: Not on file  Other Topics Concern  . Not on file  Social History Narrative  . Not on file   Social Determinants of Health   Financial Resource Strain:   . Difficulty of Paying Living Expenses: Not on file  Food Insecurity:   . Worried About Charity fundraiser in the Last Year: Not on file  . Ran Out of Food in the Last Year: Not on file  Transportation Needs:   . Lack of Transportation (Medical): Not on file  . Lack of Transportation (Non-Medical): Not on file  Physical Activity:   . Days of Exercise per Week: Not on file  . Minutes of Exercise per Session: Not on file    Stress:   . Feeling of Stress : Not on file  Social Connections:   . Frequency of Communication with Friends and Family: Not on file  . Frequency of Social Gatherings with Friends and Family: Not on file  . Attends Religious Services: Not on file  . Active Member of Clubs or Organizations: Not on file  . Attends Archivist Meetings: Not on file  . Marital Status: Not on file     Family History: The patient's family history includes Breast cancer in his mother; Colon polyps in his maternal aunt and maternal uncle; Diabetes in his father, maternal grandmother, mother, and paternal grandmother; Heart disease in his father. There is no history of Colon cancer, Esophageal cancer, Stomach cancer, Rectal cancer, or Pancreatic cancer.  ROS:   All other ROS reviewed and negative. Pertinent positives noted in the HPI.     EKGs/Labs/Other Studies Reviewed:   The following studies were personally reviewed by me today:  CCTA 09/04/2019 IMPRESSION: 1. Coronary calcium score of 0.  2. Normal coronary origin with right dominance.  3. Minimal non-calcified plaque in the pLAD (<25%) that is non-obstructive. Normal LCX.  4. Significant stairstep artifact noted from movement, but RCA appears to be normal without evidence of plaque or stenosis.  5. 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 CAD-RADS 1: Minimal non-obstructive CAD (0-24%). Consider non-atherosclerotic causes of chest pain. Consider preventive therapy and risk factor modification.  TTER 09/04/2019  1. Left ventricular ejection fraction, by visual estimation, is 60 to 65%. The left ventricle has normal function. Normal left ventricular size. There is no left ventricular hypertrophy.  2. Left ventricular diastolic Doppler parameters are consistent with impaired relaxation pattern of LV diastolic filling.  3. Global right ventricle has normal systolic function.The right  ventricular size is normal. No increase in right ventricular wall thickness.  4. Left atrial size was normal.  5. Right atrial size was normal.  6. The mitral valve is normal in structure. No evidence of mitral valve regurgitation. No evidence of mitral stenosis.  7. The tricuspid valve is normal in structure. Tricuspid valve regurgitation was not visualized by color flow Doppler.  8. The aortic valve is normal in structure. Aortic valve regurgitation was not visualized by color flow Doppler. Structurally normal aortic valve, with no evidence of sclerosis or stenosis.  9. The pulmonic valve was normal in structure. Pulmonic valve regurgitation is trivial by color flow Doppler. 10. TR signal is inadequate for assessing pulmonary artery systolic pressure. 11. The inferior vena  cava is normal in size with greater than 50% respiratory variability, suggesting right atrial pressure of 3 mmHg.  Ambulatory ECG 11/2019 NSR No PAF One episode of "VT" mislabeled only a few atrial beats No significant arrhythmia   Recent Labs: 05/25/2019: TSH 2.12 09/11/2019: ALT 25; BUN 16; Creat 1.18; Hemoglobin 15.5; Magnesium 2.1; Platelets 170; Potassium 4.1; Sodium 141   Recent Lipid Panel    Component Value Date/Time   CHOL 175 05/25/2019 0951   TRIG 121 05/25/2019 0951   HDL 48 05/25/2019 0951   CHOLHDL 3.6 05/25/2019 0951   VLDL 26 03/18/2017 1107   LDLCALC 105 (H) 05/25/2019 0951    Physical Exam:   VS:  BP (!) 151/96   Pulse (!) 102   Ht 5\' 9"  (1.753 m)   Wt 224 lb 3.2 oz (101.7 kg)   SpO2 96%   BMI 33.11 kg/m    Wt Readings from Last 3 Encounters:  11/17/19 224 lb 3.2 oz (101.7 kg)  09/12/19 230 lb (104.3 kg)  09/07/19 228 lb (103.4 kg)    General: Well nourished, well developed, in no acute distress Heart: Atraumatic, normal size  Eyes: PEERLA, EOMI  Neck: Supple, no JVD Endocrine: No thryomegaly Cardiac: Normal S1, S2; RRR; no murmurs, rubs, or gallops Lungs: Clear to auscultation  bilaterally, no wheezing, rhonchi or rales  Abd: Soft, nontender, no hepatomegaly  Ext: No edema, pulses 2+ Musculoskeletal: No deformities, BUE and BLE strength normal and equal Skin: Warm and dry, no rashes   Neuro: Alert and oriented to person, place, time, and situation, CNII-XII grossly intact, no focal deficits  Psych: Normal mood and affect   ASSESSMENT:   Leighland Misfeldt is a 63 y.o. male who presents for the following: 1. Syncope, unspecified syncope type   2. Coronary artery disease involving native coronary artery of native heart without angina pectoris   3. Mixed hyperlipidemia     PLAN:   1. Syncope, unspecified syncope type -Normal echocardiogram, and nonobstructive CAD seen on cardiac CTA.  His 30-day event monitor demonstrated no significant cardiac arrhythmias.  Neurology has done their evaluation and believe he possibly had a seizure.  I discussed with him extensively about his test results and what they mean.  A cardiac explanation is not likely here.  We will not continue with any further cardiac work-up at this time.  2. Coronary artery disease involving native coronary artery of native heart without angina pectoris -Minimal noncalcified plaque seen in the proximal LAD. -We will start an aspirin 81 mg -We will start Crestor 10 mg daily and recheck a lipid profile in a few months.  I will see him back in person in 3 months to discuss the results.  3. Mixed hyperlipidemia -Crestor as above  Disposition: Return in about 3 months (around 02/15/2020).  Medication Adjustments/Labs and Tests Ordered: Current medicines are reviewed at length with the patient today.  Concerns regarding medicines are outlined above.  Orders Placed This Encounter  Procedures  . Lipid panel  . Hepatic function panel   No orders of the defined types were placed in this encounter.  Patient Instructions  Your physician has recommended you make the following change in your medication:    START ASPIRIN 81 MG EVERY DAY  ROSUVASTATIN 10 MG EVERY PM  Your physician recommends that you return for lab work in: [place] physician recommends that you schedule a follow-up appointment in:  [place]  Signed, Addison Naegeli. Audie Box, [place]  [address] [place], [place] [address] [phone]  11/17/2019 2:04 PM

## 2019-11-17 ENCOUNTER — Ambulatory Visit (INDEPENDENT_AMBULATORY_CARE_PROVIDER_SITE_OTHER): Payer: 59 | Admitting: Cardiovascular Disease

## 2019-11-17 ENCOUNTER — Other Ambulatory Visit: Payer: Self-pay

## 2019-11-17 ENCOUNTER — Encounter: Payer: Self-pay | Admitting: Cardiovascular Disease

## 2019-11-17 VITALS — BP 151/96 | HR 102 | Ht 69.0 in | Wt 224.2 lb

## 2019-11-17 DIAGNOSIS — R55 Syncope and collapse: Secondary | ICD-10-CM

## 2019-11-17 DIAGNOSIS — E782 Mixed hyperlipidemia: Secondary | ICD-10-CM | POA: Diagnosis not present

## 2019-11-17 DIAGNOSIS — I2510 Atherosclerotic heart disease of native coronary artery without angina pectoris: Secondary | ICD-10-CM | POA: Diagnosis not present

## 2019-11-17 MED ORDER — ROSUVASTATIN CALCIUM 10 MG PO TABS: 10.0000 mg | ORAL_TABLET | Freq: Every day | ORAL | 3 refills | Status: DC

## 2019-11-17 MED ORDER — ASPIRIN EC 81 MG PO TBEC: 81.0000 mg | DELAYED_RELEASE_TABLET | Freq: Every day | ORAL | 3 refills | Status: DC

## 2019-11-17 NOTE — Patient Instructions (Signed)
Your physician has recommended you make the following change in your medication:  START ASPIRIN 81 MG EVERY DAY  ROSUVASTATIN 10 MG EVERY PM  Your physician recommends that you return for lab work in: [place] physician recommends that you schedule a follow-up appointment in:  [place]

## 2019-12-18 ENCOUNTER — Ambulatory Visit (INDEPENDENT_AMBULATORY_CARE_PROVIDER_SITE_OTHER): Payer: 59 | Admitting: Neurology

## 2019-12-18 ENCOUNTER — Other Ambulatory Visit: Payer: Self-pay | Admitting: Adult Health Nurse Practitioner

## 2019-12-18 ENCOUNTER — Other Ambulatory Visit: Payer: Self-pay

## 2019-12-18 ENCOUNTER — Encounter: Payer: Self-pay | Admitting: Neurology

## 2019-12-18 VITALS — BP 149/96 | HR 79 | Temp 97.2°F | Ht 68.0 in | Wt 223.0 lb

## 2019-12-18 DIAGNOSIS — R569 Unspecified convulsions: Secondary | ICD-10-CM

## 2019-12-18 NOTE — Progress Notes (Signed)
[facility] [address] [place]. [place] [address] [phone]       OFFICE FOLLOW UP VISIT NOTE  Mr. Yonason Fichter Date of Birth:  1956-02-01 Medical Record Number:  [id number]   Referring MD: Eulogio Bear Reason for Referral: Syncope versus seizure HPI: Initial visit 09/12/2019 :Mr. Dwinell is a 64 year old pleasant Caucasian male seen today for initial office consultation visit.  He is accompanied by his wife.  History is obtained from them, review of electronic medical records and have personally reviewed imaging films in PACS.  Mr. Serven is a 64 year old male with past medical history significant for arthritis, gastroesophageal reflux disease, vertigo who presented to [facility] on 09/03/2019 for evaluation for episode of brief loss of consciousness.  Patient states that he had a usual day and he and his wife and gone to the house warming at a friend's house.  His sister-in-law was driving and he was sitting in the backseat and around 5 PM he remembers feeling dizzy and his vision becoming dark.  He denied any chest pain, shortness of breath or palpitations.  His wife happened to look back and noticed that his head was tilted back and he was unresponsive and staring ahead.  He did not respond to his wife's eyes stayed open he started making some noises and she noticed that both his arms were moving up and jerking briefly.  He finally responded and was confused and disoriented and did not know what happened.  He had urinary incontinence during this episode.  He did not have any tongue bite or tonic-clonic activity.  He did feel weak all over but was able to walk on his own to the emergency room.  He was noticed to have sweated profusely in all extremities but his vital signs and blood pressure upon arrival were apparently stable.  His EKG showed no acute changes.  Chest x-ray was negative for any acute disease.  He had a cardiac CT angiogram which showed that he was  at low risk for cardiac events.  An outpatient 30-day heart monitor was recommended but never got done.  EEG was normal.  MRI scan of the brain showed no abnormalities.  Transthoracic echo was unremarkable.  Patient states is done well since then and had no further episodes.  He has no previous episodes of passing out or losing consciousness seizure or significant head injury with loss of consciousness.  He has had possible a few presyncopal events but he was able to sit down and have the feeling passes.  There is no family strip seizures or syncope.  He has no complaints today Update 12/18/2019 : He returns for follow-up after last visit 3 months ago.  He states he is doing well he has had no further episodes of seizure or seizure-like episodes or syncopal events.  He had MRI scan of the brain done previously on 09/04/2019 which was normal.  Repeat EEG on 09/28/2019 was also normal as was the previous EEG on 09/04/2019.  Patient has not been driving.  He has no new complaints.  He does not drink alcohol or do drugs.  He has been avoiding seizure provoking factors like irregular sleeping or eating habits.  He has decided not to go on empirical anticonvulsants unless he has a second unprovoked episode. ROS:   14 system review of systems is positive for syncope versus seizure episode only  and all other systems negative  PMH:  Past Medical History:  Diagnosis Date  . Arthritis   .  Arthritis   . Carpal tunnel syndrome on both sides   . Erectile dysfunction   . GERD (gastroesophageal reflux disease)   . Seizure ([place])   . Vertigo     Social History:  Social History   Socioeconomic History  . Marital status: Married    Spouse name: Not on file  . Number of children: 3  . Years of education: Not on file  . Highest education level: Not on file  Occupational History  . Not on file  Tobacco Use  . Smoking status: Never Smoker  . Smokeless tobacco: Never Used  Substance and Sexual Activity  .  Alcohol use: No  . Drug use: No  . Sexual activity: Not on file  Other Topics Concern  . Not on file  Social History Narrative   Lives at home with wife   Right handed   Caffeine: 2-3 cups a week   Social Determinants of Health   Financial Resource Strain:   . Difficulty of Paying Living Expenses: Not on file  Food Insecurity:   . Worried About Charity fundraiser in the Last Year: Not on file  . Ran Out of Food in the Last Year: Not on file  Transportation Needs:   . Lack of Transportation (Medical): Not on file  . Lack of Transportation (Non-Medical): Not on file  Physical Activity:   . Days of Exercise per Week: Not on file  . Minutes of Exercise per Session: Not on file  Stress:   . Feeling of Stress : Not on file  Social Connections:   . Frequency of Communication with Friends and Family: Not on file  . Frequency of Social Gatherings with Friends and Family: Not on file  . Attends Religious Services: Not on file  . Active Member of Clubs or Organizations: Not on file  . Attends Archivist Meetings: Not on file  . Marital Status: Not on file  Intimate Partner Violence:   . Fear of Current or Ex-Partner: Not on file  . Emotionally Abused: Not on file  . Physically Abused: Not on file  . Sexually Abused: Not on file    Medications:   Current Outpatient Medications on File Prior to Visit  Medication Sig Dispense Refill  . aspirin EC 81 MG tablet Take 1 tablet (81 mg total) by mouth daily. 90 tablet 3  . Cholecalciferol (VITAMIN D3) 2000 UNITS TABS Take 3 capsules by mouth daily.     . Magnesium 250 MG TABS Take 0.8 tablets (200 mg total) by mouth daily. 30 tablet 1  . Multiple Vitamins-Minerals (MULTIVITAMIN WITH MINERALS) tablet Take 1 tablet by mouth daily.    . RABEprazole (ACIPHEX) 20 MG tablet Take 1 tablet (20 mg total) by mouth daily. 30 tablet 1  . rosuvastatin (CRESTOR) 10 MG tablet Take 1 tablet (10 mg total) by mouth daily. 90 tablet 3  .  tadalafil (CIALIS) 20 MG tablet Take 1 tablet (20 mg total) by mouth daily as needed for erectile dysfunction. 10 tablet 2  . vitamin C (ASCORBIC ACID) 500 MG tablet Take 500 mg by mouth daily.     No current facility-administered medications on file prior to visit.    Allergies:  No Known Allergies  Physical Exam General: Mildly obese middle-aged Caucasian male, seated, in no evident distress Head: head normocephalic and atraumatic.   Neck: supple with no carotid or supraclavicular bruits Cardiovascular: regular rate and rhythm, no murmurs Musculoskeletal: no deformity Skin:  no rash/petichiae  Vascular:  Normal pulses all extremities  Neurologic Exam Mental Status: Awake and fully alert. Oriented to place and time. Recent and remote memory intact. Attention span, concentration and fund of knowledge appropriate. Mood and affect appropriate.  Cranial Nerves: Fundoscopic exam not done. Pupils equal, briskly reactive to light. Extraocular movements full without nystagmus. Visual fields full to confrontation. Hearing intact. Facial sensation intact. Face, tongue, palate moves normally and symmetrically.  Motor: Normal bulk and tone. Normal strength in all tested extremity muscles. Sensory.: intact to touch , pinprick , position and vibratory sensation.  Coordination: Rapid alternating movements normal in all extremities. Finger-to-nose and heel-to-shin performed accurately bilaterally. Gait and Station: Arises from chair without difficulty. Stance is normal. Gait demonstrates normal stride length and balance . Able to heel, toe and tandem walk with moderate difficulty.  Reflexes: 1+ and symmetric. Toes downgoing.       ASSESSMENT: 64 year old Caucasian male with single episode of unprovoked brief altered consciousness with staring, arms jerking and incontinence possibly complex partial seizure though prolonged  convulsive syncope is also possible though less likely    PLAN: I had a long  discussion with the patient regarding his solitary episode of unprovoked brief loss of consciousness in September 2020 possibly unwitnessed seizure with negative neuroimaging and EEG studies.  He does remain at lifelong risk of having further seizures at 50% and has chosen not to go on a trial of anticonvulsants at the present time.  I advised him not to drive for a few more months as per [facility].  He was also advised to avoid seizure provoking stimuli like sleep deprivation, irregular eating and sleeping habits, alcohol and stimulants.  No routine schedule appointment with me is necessary but he may return back in the future as needed. Greater than 50% time during this 25-minute consultation visit was spent on counseling and coordination of care about his episode of seizure versus syncope and answering questions.  Antony Contras, MD  12/18/2019 11:04 AM  Note: This document was prepared with digital dictation and possible smart phrase technology. Any transcriptional errors that result from this process are unintentional.

## 2019-12-18 NOTE — Patient Instructions (Signed)
I had a long discussion with the patient regarding his solitary episode of unprovoked brief loss of consciousness in September 2020 possibly unwitnessed seizure with negative neuroimaging and EEG studies.  He does remain at lifelong risk of having further seizures at 50% and has chosen not to go on a trial of anticonvulsants at the present time.  I advised him not to drive for a few more months as per [facility].  He was also advised to avoid seizure provoking stimuli like sleep deprivation, irregular eating and sleeping habits, alcohol and stimulants.  No routine schedule appointment with me is necessary but he may return back in the future as needed.

## 2020-02-14 ENCOUNTER — Other Ambulatory Visit: Payer: Self-pay

## 2020-02-14 DIAGNOSIS — E782 Mixed hyperlipidemia: Secondary | ICD-10-CM

## 2020-02-14 LAB — HEPATIC FUNCTION PANEL
ALT: 24 IU/L (ref 0–44)
AST: 24 IU/L (ref 0–40)
Albumin: 4.7 g/dL (ref 3.8–4.8)
Alkaline Phosphatase: 86 IU/L (ref 39–117)
Bilirubin Total: 0.5 mg/dL (ref 0.0–1.2)
Bilirubin, Direct: 0.18 mg/dL (ref 0.00–0.40)
Total Protein: 6.7 g/dL (ref 6.0–8.5)

## 2020-02-14 LAB — LIPID PANEL
Chol/HDL Ratio: 2.5 ratio (ref 0.0–5.0)
Cholesterol, Total: 114 mg/dL (ref 100–199)
HDL: 45 mg/dL (ref >39)
LDL Chol Calc (NIH): 49 mg/dL (ref 0–99)
Triglycerides: 112 mg/dL (ref 0–149)
VLDL Cholesterol Cal: 20 mg/dL (ref 5–40)

## 2020-02-16 ENCOUNTER — Ambulatory Visit (INDEPENDENT_AMBULATORY_CARE_PROVIDER_SITE_OTHER): Payer: 59 | Admitting: Cardiovascular Disease

## 2020-02-16 ENCOUNTER — Other Ambulatory Visit: Payer: Self-pay

## 2020-02-16 ENCOUNTER — Encounter: Payer: Self-pay | Admitting: Cardiovascular Disease

## 2020-02-16 VITALS — BP 118/82 | HR 89 | Ht 68.0 in | Wt 222.8 lb

## 2020-02-16 DIAGNOSIS — I2510 Atherosclerotic heart disease of native coronary artery without angina pectoris: Secondary | ICD-10-CM

## 2020-02-16 DIAGNOSIS — E782 Mixed hyperlipidemia: Secondary | ICD-10-CM

## 2020-02-16 DIAGNOSIS — E669 Obesity, unspecified: Secondary | ICD-10-CM

## 2020-02-16 NOTE — Progress Notes (Signed)
Cardiology Office Note:   Date:  02/16/2020  NAME:  Barry Miles    MRN: [id number] DOB:  [birth year]   PCP:  Unk Pinto, MD  Cardiologist:  No primary care provider on file.   Referring MD: Unk Pinto, MD   Chief Complaint  Patient presents with  . Coronary Artery Disease   History of Present Illness:   Barry Miles is a 64 y.o. male with a hx of syncope/seizure?, nonobstructive CAD, HLD who presents for follow-up of hyperlipidemia. Recently started on crestor. LDL is now 49. At goal.  He reports he is doing well.  No syncopal events reported.  He is still not driving and has to wait 6 months without a seizure before he will be able to drive.  We did start him on Crestor and is tolerating this quite well.  His most recent LDL cholesterol is at goal.  He has no chest pain or shortness of breath with activity.  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  There is no limitations reported.  He does report some dry mouth but this is not related to his aspirin or Crestor.  Blood pressure is well controlled today on no medications.  He report he will start to exercise and diet a bit better moving forward.  He does need to lose weight.  Problem List 1. Non-obstructive CAD -minimal <25% pLAD 2. Syncope -> Seizure? -negative 30 day event monitor  3. HLD -T chol 114, HDL 45, TG 112, LDL 49  Past Medical History: Past Medical History:  Diagnosis Date  . Arthritis   . Arthritis   . Carpal tunnel syndrome on both sides   . Erectile dysfunction   . GERD (gastroesophageal reflux disease)   . Seizure ([place])   . Vertigo     Past Surgical History: Past Surgical History:  Procedure Laterality Date  . COLONOSCOPY    . POLYPECTOMY    . TONSILLECTOMY    . WISDOM TOOTH EXTRACTION      Current Medications: Current Meds  Medication Sig  . aspirin EC 81 MG tablet Take 1 tablet (81 mg total) by mouth daily.  . Cholecalciferol (VITAMIN D3) 2000 UNITS  TABS Take 3 capsules by mouth daily.   . Magnesium 250 MG TABS Take 0.8 tablets (200 mg total) by mouth daily.  . Multiple Vitamins-Minerals (MULTIVITAMIN WITH MINERALS) tablet Take 1 tablet by mouth daily.  . RABEprazole (ACIPHEX) 20 MG tablet Take 1 tablet Daily for Indigestion & acid Reflux  . rosuvastatin (CRESTOR) 10 MG tablet Take 1 tablet (10 mg total) by mouth daily.  . tadalafil (CIALIS) 20 MG tablet Take 1 tablet (20 mg total) by mouth daily as needed for erectile dysfunction.  . vitamin C (ASCORBIC ACID) 500 MG tablet Take 500 mg by mouth daily.     Allergies:    Patient has no known allergies.   Social History: Social History   Socioeconomic History  . Marital status: Married    Spouse name: Not on file  . Number of children: 3  . Years of education: Not on file  . Highest education level: Not on file  Occupational History  . Not on file  Tobacco Use  . Smoking status: Never Smoker  . Smokeless tobacco: Never Used  Substance and Sexual Activity  . Alcohol use: No  . Drug use: No  . Sexual activity: Not on file  Other Topics Concern  . Not on file  Social  History Narrative   Lives at home with wife   Right handed   Caffeine: 2-3 cups a week   Social Determinants of Health   Financial Resource Strain:   . Difficulty of Paying Living Expenses:   Food Insecurity:   . Worried About Charity fundraiser in the Last Year:   . Arboriculturist in the Last Year:   Transportation Needs:   . Film/video editor (Medical):   Marland Kitchen Lack of Transportation (Non-Medical):   Physical Activity:   . Days of Exercise per Week:   . Minutes of Exercise per Session:   Stress:   . Feeling of Stress :   Social Connections:   . Frequency of Communication with Friends and Family:   . Frequency of Social Gatherings with Friends and Family:   . Attends Religious Services:   . Active Member of Clubs or Organizations:   . Attends Archivist Meetings:   Marland Kitchen Marital Status:       Family History: The patient's family history includes Breast cancer in his mother; Colon polyps in his maternal aunt and maternal uncle; Diabetes in his father, maternal grandmother, mother, and paternal grandmother; Heart disease in his father. There is no history of Colon cancer, Esophageal cancer, Stomach cancer, Rectal cancer, Pancreatic cancer, or Seizures.  ROS:   All other ROS reviewed and negative. Pertinent positives noted in the HPI.     EKGs/Labs/Other Studies Reviewed:   The following studies were personally reviewed by me today:  CCTA 09/04/2019 IMPRESSION: 1. Coronary calcium score of 0.  2. Normal coronary origin with right dominance.  3. Minimal non-calcified plaque in the pLAD (<25%) that is non-obstructive. Normal LCX.  4. Significant stairstep artifact noted from movement, but RCA appears to be normal without evidence of plaque or stenosis.  5. 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 CAD-RADS 1: Minimal non-obstructive CAD (0-24%). Consider non-atherosclerotic causes of chest pain. Consider preventive therapy and risk factor modification.  TTE 09/04/2019 1. Left ventricular ejection fraction, by visual estimation, is 60 to 65%. The left ventricle has normal function. Normal left ventricular size. There is no left ventricular hypertrophy. 2. Left ventricular diastolic Doppler parameters are consistent with impaired relaxation pattern of LV diastolic filling. 3. Global right ventricle has normal systolic function.The right ventricular size is normal. No increase in right ventricular wall thickness. 4. Left atrial size was normal. 5. Right atrial size was normal. 6. The mitral valve is normal in structure. No evidence of mitral valve regurgitation. No evidence of mitral stenosis. 7. The tricuspid valve is normal in structure. Tricuspid valve regurgitation was not visualized by color flow Doppler. 8.  The aortic valve is normal in structure. Aortic valve regurgitation was not visualized by color flow Doppler. Structurally normal aortic valve, with no evidence of sclerosis or stenosis. 9. The pulmonic valve was normal in structure. Pulmonic valve regurgitation is trivial by color flow Doppler. 10. TR signal is inadequate for assessing pulmonary artery systolic pressure. 11. The inferior vena cava is normal in size with greater than 50% respiratory variability, suggesting right atrial pressure of 3 mmHg.  Recent Labs: 05/25/2019: TSH 2.12 09/11/2019: BUN 16; Creat 1.18; Hemoglobin 15.5; Magnesium 2.1; Platelets 170; Potassium 4.1; Sodium 141 02/14/2020: ALT 24   Recent Lipid Panel    Component Value Date/Time   CHOL 114 02/14/2020 0818   TRIG 112 02/14/2020 0818   HDL 45 02/14/2020 0818   CHOLHDL  2.5 02/14/2020 0818   CHOLHDL 3.6 05/25/2019 0951   VLDL 26 03/18/2017 1107   LDLCALC 49 02/14/2020 0818   LDLCALC 105 (H) 05/25/2019 0951    Physical Exam:   VS:  BP 118/82   Pulse 89   Ht 5\' 8"  (1.727 m)   Wt 222 lb 12.8 oz (101.1 kg)   SpO2 97%   BMI 33.88 kg/m    Wt Readings from Last 3 Encounters:  02/16/20 222 lb 12.8 oz (101.1 kg)  12/18/19 223 lb (101.2 kg)  11/17/19 224 lb 3.2 oz (101.7 kg)    General: Well nourished, well developed, in no acute distress Heart: Atraumatic, normal size  Eyes: PEERLA, EOMI  Neck: Supple, no JVD Endocrine: No thryomegaly Cardiac: Normal S1, S2; RRR; no murmurs, rubs, or gallops Lungs: Clear to auscultation bilaterally, no wheezing, rhonchi or rales  Abd: Soft, nontender, no hepatomegaly  Ext: No edema, pulses 2+ Musculoskeletal: No deformities, BUE and BLE strength normal and equal Skin: Warm and dry, no rashes   Neuro: Alert and oriented to person, place, time, and situation, CNII-XII grossly intact, no focal deficits  Psych: Normal mood and affect   ASSESSMENT:   Barry Miles is a 64 y.o. male who presents for the following: 1.  Coronary artery disease involving native coronary artery of native heart without angina pectoris   2. Mixed hyperlipidemia   3. Obesity (BMI 30-39.9)     PLAN:   1. Coronary artery disease involving native coronary artery of native heart without angina pectoris 2. Mixed hyperlipidemia -Proximal LAD with minimal calcified plaque.  Was found in setting of syncope.  Determined to have had a seizure by neurology.  No symptoms of angina or concerns for progression of heart disease.  He will continue aspirin.  Most recent LDL cholesterol is at goal.  He will continue Crestor 10 mg daily.  We will see him on a yearly basis.  3. Obesity (BMI 30-39.9) -Advised to exercise and diet.  Disposition: Return in about 1 year (around 02/15/2021).  Medication Adjustments/Labs and Tests Ordered: Current medicines are reviewed at length with the patient today.  Concerns regarding medicines are outlined above.  No orders of the defined types were placed in this encounter.  No orders of the defined types were placed in this encounter.   Patient Instructions  Medication Instructions:  The current medical regimen is effective;  continue present plan and medications.  *If you need a refill on your cardiac medications before your next appointment, please call your pharmacy*   Follow-Up: At [facility], you and your health needs are our priority.  As part of our continuing mission to provide you with exceptional heart care, we have created designated Provider Care Teams.  These Care Teams include your primary Cardiologist (physician) and Advanced Practice Providers (APPs -  Physician Assistants and Nurse Practitioners) who all work together to provide you with the care you need, when you need it.  We recommend signing up for the patient portal called "MyChart".  Sign up information is provided on this After Visit Summary.  MyChart is used to connect with patients for Virtual Visits (Telemedicine).  Patients  are able to view lab/test results, encounter notes, upcoming appointments, etc.  Non-urgent messages can be sent to your provider as well.   To learn more about what you can do with MyChart, go to [email or web address].    Your next appointment:   12 month(s)  The format for your next appointment:   In  Person  Provider:   Eleonore Chiquito, MD       Time Spent with Patient: I have spent a total of 35 minutes with patient reviewing hospital notes, telemetry, EKGs, labs and examining the patient as well as establishing an assessment and plan that was discussed with the patient.  > 50% of time was spent in direct patient care.  Signed, Addison Naegeli. Audie Box, [place]  [address] [place], [place] [address] [phone]  02/16/2020 2:29 PM

## 2020-02-16 NOTE — Patient Instructions (Signed)

## 2020-06-04 ENCOUNTER — Encounter: Payer: 59 | Admitting: Adult Health Nurse Practitioner

## 2020-06-17 NOTE — Progress Notes (Signed)
Complete Physical  Assessment and Plan:  Joel was seen today for annual exam.  Diagnoses and all orders for this visit:  Essential hypertension Controlled today, no medications Monitor blood pressure at home; call if consistently over 130/80 Continue DASH diet.   Reminder to go to the ER if any CP, SOB, nausea, dizziness, severe HA, changes vision/speech, left arm numbness and tingling and jaw pain. -     CBC with Differential/Platelet -     COMPLETE METABOLIC PANEL WITH GFR -     TSH -     EKG 12-Lead  Gastroesophageal reflux disease with esophagitis without hemorrhage Doing well at this time Continue:  Diet discussed Monitor for triggers Avoid food with high acid content Avoid excessive cafeine Increase water intake -     Magnesium  Tachycardia Doing well today Continue to monitor  Seizure (HCC) Controlled, no further episodes No medications  Calculus of gallbladder without cholecystitis without obstruction Asymptomatic continue to monitor  Hiatal hernia Asymptomatic Continue to monitor  Erectile dysfunction, unspecified erectile dysfunction type Doing well at this time Has Cialis PRN  Medication management Continued  Carpal tunnel syndrome on both sides Managing well at this time Wearing braces at times  Mixed hyperlipidemia Continue medications: Discussed dietary and exercise modifications Low fat diet -     Lipid panel  Screening for diabetes mellitus -     Hemoglobin A1c  Vitamin D deficiency Continue supplementation to maintain goal of 60-100 Taking Vitamin D 2,000 IU three capsules daily -     VITAMIN D 25 Hydroxy (Vit-D Deficiency, Fractures)  Screening for blood or protein in urine -     Urinalysis w microscopic + reflex cultur -     Microalbumin / creatinine urine ratio  Screening for deficiency anemia -     Iron,Total/Total Iron Binding Cap  Screening PSA (prostate specific antigen) -     PSA  Encounter for vitamin  deficiency screening -     Vitamin B12  Toenail fungus -     terbinafine (LAMISIL) 250 MG tablet; Take 1 tablet (250 mg total) by mouth daily. Follow up in one month for lab only Other orders -     REFLEXIVE URINE CULTURE   Over 40 minutes of face to face interview, exam, counseling, chart review and critical decision making was performed Discussed med's effects and SE's. Screening labs and tests as requested with regular follow-up as recommended.  HPI Patient presents for a complete physical.   He has a history of GERD and taking Prilosec and doing well.  He also complains of bilateral toenail fungus, worse on the left than right last visit.  Reports this has improved and almost resolved. 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  He denies chest pain, shortness of breath, dizziness.   He is on cholesterol medication and denies myalgias. His cholesterol is at goal. The cholesterol last visit was:   Lab Results  Component Value Date   CHOL 145 03/22/2018   HDL 44 03/22/2018   LDLCALC 75 03/22/2018   TRIG 159 (H) 03/22/2018   CHOLHDL 3.3 03/22/2018   He has been working on diet and exercise for prediabetes, and denies foot ulcerations, hyperglycemia, hypoglycemia , increased appetite, nausea, paresthesia of the feet, polydipsia, polyuria, visual disturbances, vomiting and weight loss. Last A1C in the office was:  Lab Results  Component Value Date   HGBA1C 4.9 03/18/2017   Patient is on Vitamin D supplement.   Lab Results  Component Value Date   VD25OH 76 03/22/2018   Last PSA was normal and he has not BPH symptoms: Lab Results  Component Value Date   PSA 0.9 03/22/2018   BMI is There is no height or weight on file to calculate BMI., he is working on diet and exercise. Wt Readings from Last 3 Encounters:  02/16/20 222 lb 12.8 oz  (101.1 kg)  12/18/19 223 lb (101.2 kg)  11/17/19 224 lb 3.2 oz (101.7 kg)    Current Medications:  Current Outpatient Medications on File Prior to Visit  Medication Sig Dispense Refill  . aspirin EC 81 MG tablet Take 1 tablet (81 mg total) by mouth daily. 90 tablet 3  . Cholecalciferol (VITAMIN D3) 2000 UNITS TABS Take 3 capsules by mouth daily.     . Magnesium 250 MG TABS Take 0.8 tablets (200 mg total) by mouth daily. 30 tablet 1  . Multiple Vitamins-Minerals (MULTIVITAMIN WITH MINERALS) tablet Take 1 tablet by mouth daily.    . RABEprazole (ACIPHEX) 20 MG tablet Take 1 tablet Daily for Indigestion & acid Reflux 90 tablet 1  . rosuvastatin (CRESTOR) 10 MG tablet Take 1 tablet (10 mg total) by mouth daily. 90 tablet 3  . tadalafil (CIALIS) 20 MG tablet Take 1 tablet (20 mg total) by mouth daily as needed for erectile dysfunction. 10 tablet 2  . vitamin C (ASCORBIC ACID) 500 MG tablet Take 500 mg by mouth daily.     No current facility-administered medications on file prior to visit.    Health Maintenance:  Immunization History  Administered Date(s) Administered  . Influenza-Unspecified 09/06/2013  . PPD Test 03/02/2014  . Td 08/27/2005  . Tdap 02/01/2013   Tetanus: 2014 Flu vaccine: 2018, at work Colonoscopy: 2019, Dr. Fuller Plan, 3-5years Eye Exam:  Dr. Peter Garter , Glasses, goes yearly, due for [address] Dentist: Dr. Reola Calkins   05/2019, due for 2021  Patient Care Team: Unk Pinto, MD as PCP - General (Internal Medicine) Rutherford Guys, MD as Consulting Physician (Ophthalmology) Fay Records, MD as Consulting Physician (Cardiology) Ladene Artist, MD as Consulting Physician (Gastroenterology) Franchot Gallo, MD as Consulting Physician (Urology) Roseanne Kaufman, MD as Consulting Physician (Orthopedic Surgery)  Medical History:  Past Medical History:  Diagnosis Date  . Arthritis   . Arthritis   . Carpal tunnel syndrome on both sides   . Erectile dysfunction   . GERD  (gastroesophageal reflux disease)   . Seizure ([place])   . Vertigo    Allergies No Known Allergies  SURGICAL HISTORY He  has a past surgical history that includes Tonsillectomy; Colonoscopy; Polypectomy; and Wisdom tooth extraction. FAMILY HISTORY His family history includes Breast cancer in his mother; Colon polyps in his maternal aunt and maternal uncle; Diabetes in his father, maternal grandmother, mother, and paternal grandmother; Heart disease in his father. SOCIAL HISTORY He  reports that he has never smoked. He has never used smokeless tobacco. He reports that he does not drink alcohol and does not use drugs.   Review of Systems:  Review of Systems  Constitutional: Negative for chills, fever and malaise/fatigue.  HENT: Negative for congestion, ear pain and sore throat.   Eyes: Negative.   Respiratory: Negative for cough, shortness of breath and wheezing.   Cardiovascular: Negative for chest pain, palpitations and leg swelling.  Gastrointestinal: Positive for heartburn. Negative for abdominal pain,  blood in stool, constipation, diarrhea and melena.  Genitourinary: Negative.   Musculoskeletal: Positive for back pain and joint pain.  Neurological: Negative for dizziness, sensory change, loss of consciousness and headaches.  Psychiatric/Behavioral: Negative for depression. The patient is not nervous/anxious and does not have insomnia.     Physical Exam: Estimated body mass index is 33.88 kg/m as calculated from the following:   Height as of 02/16/20: 5\' 8"  (1.727 m).   Weight as of 02/16/20: 222 lb 12.8 oz (101.1 kg). There were no vitals taken for this visit.  General Appearance: Well nourished, in no apparent distress.  Eyes: PERRLA, EOMs, conjunctiva no swelling or erythema ENT/Mouth: Ear canals clear bilaterally with no erythema, swelling, discharge.  TMs normal bilaterally with no erythema, bulging, or retractions.  Oropharynx clear and moist with no exudate, swelling, or  erythema.  Dentition normal.   Neck: Supple, thyroid normal. No bruits, JVD, cervical adenopathy Respiratory: Respiratory effort normal, BS equal bilaterally without rales, rhonchi, wheezing or stridor.  Cardio: RRR without murmurs, rubs or gallops. Brisk peripheral pulses without edema.  Chest: symmetric, with normal excursions Abdomen: Soft, nontender, no guarding, rebound, hernias, masses, or organomegaly. Genitourinary: Patient preference to defer to Dr. Melford Aase or Dr. Payton Mccallum for prostate exam Musculoskeletal: Full ROM all peripheral extremities,5/5 strength, and normal gait.  Skin: Warm, dry without rashes, lesions, ecchymosis. 4-5 scattered sebborrheic keratoses located on the back Neuro: A&Ox3, Cranial nerves intact, reflexes equal bilaterally. Normal muscle tone, no cerebellar symptoms. Sensation intact.  Psych: Normal affect, Insight and Judgment appropriate.   EKG: Sinus rhythm w/ no ST changes  Garnet Sierras, NP 09:45 AM [facility]

## 2020-06-20 ENCOUNTER — Other Ambulatory Visit: Payer: Self-pay

## 2020-06-20 ENCOUNTER — Encounter: Payer: Self-pay | Admitting: Adult Health Nurse Practitioner

## 2020-06-20 ENCOUNTER — Ambulatory Visit (INDEPENDENT_AMBULATORY_CARE_PROVIDER_SITE_OTHER): Payer: 59 | Admitting: Adult Health Nurse Practitioner

## 2020-06-20 VITALS — BP 130/78 | HR 67 | Temp 98.1°F | Ht 68.0 in | Wt 224.0 lb

## 2020-06-20 DIAGNOSIS — Z0000 Encounter for general adult medical examination without abnormal findings: Secondary | ICD-10-CM | POA: Diagnosis not present

## 2020-06-20 DIAGNOSIS — Z131 Encounter for screening for diabetes mellitus: Secondary | ICD-10-CM

## 2020-06-20 DIAGNOSIS — E559 Vitamin D deficiency, unspecified: Secondary | ICD-10-CM | POA: Diagnosis not present

## 2020-06-20 DIAGNOSIS — Z13220 Encounter for screening for lipoid disorders: Secondary | ICD-10-CM | POA: Diagnosis not present

## 2020-06-20 DIAGNOSIS — K449 Diaphragmatic hernia without obstruction or gangrene: Secondary | ICD-10-CM

## 2020-06-20 DIAGNOSIS — R569 Unspecified convulsions: Secondary | ICD-10-CM

## 2020-06-20 DIAGNOSIS — R000 Tachycardia, unspecified: Secondary | ICD-10-CM | POA: Diagnosis not present

## 2020-06-20 DIAGNOSIS — R350 Frequency of micturition: Secondary | ICD-10-CM

## 2020-06-20 DIAGNOSIS — Z130 Encounter for screening for diseases of the blood and blood-forming organs and certain disorders involving the immune mechanism: Secondary | ICD-10-CM

## 2020-06-20 DIAGNOSIS — N401 Enlarged prostate with lower urinary tract symptoms: Secondary | ICD-10-CM | POA: Diagnosis not present

## 2020-06-20 DIAGNOSIS — G5603 Carpal tunnel syndrome, bilateral upper limbs: Secondary | ICD-10-CM

## 2020-06-20 DIAGNOSIS — Z79899 Other long term (current) drug therapy: Secondary | ICD-10-CM | POA: Diagnosis not present

## 2020-06-20 DIAGNOSIS — Z125 Encounter for screening for malignant neoplasm of prostate: Secondary | ICD-10-CM | POA: Diagnosis not present

## 2020-06-20 DIAGNOSIS — E782 Mixed hyperlipidemia: Secondary | ICD-10-CM

## 2020-06-20 DIAGNOSIS — B351 Tinea unguium: Secondary | ICD-10-CM

## 2020-06-20 DIAGNOSIS — K2100 Gastro-esophageal reflux disease with esophagitis, without bleeding: Secondary | ICD-10-CM

## 2020-06-20 DIAGNOSIS — Z1389 Encounter for screening for other disorder: Secondary | ICD-10-CM

## 2020-06-20 DIAGNOSIS — K8020 Calculus of gallbladder without cholecystitis without obstruction: Secondary | ICD-10-CM

## 2020-06-20 DIAGNOSIS — I10 Essential (primary) hypertension: Secondary | ICD-10-CM

## 2020-06-20 DIAGNOSIS — Z1321 Encounter for screening for nutritional disorder: Secondary | ICD-10-CM

## 2020-06-20 DIAGNOSIS — N529 Male erectile dysfunction, unspecified: Secondary | ICD-10-CM

## 2020-06-20 DIAGNOSIS — Z136 Encounter for screening for cardiovascular disorders: Secondary | ICD-10-CM

## 2020-06-20 MED ORDER — TERBINAFINE HCL 250 MG PO TABS: 250.0000 mg | ORAL_TABLET | Freq: Every day | ORAL | 0 refills | Status: DC

## 2020-06-21 LAB — URINALYSIS W MICROSCOPIC + REFLEX CULTURE
Bacteria, UA: NONE SEEN /HPF
Bilirubin Urine: NEGATIVE
Glucose, UA: NEGATIVE
Hgb urine dipstick: NEGATIVE
Hyaline Cast: NONE SEEN /LPF
Ketones, ur: NEGATIVE
Leukocyte Esterase: NEGATIVE
Nitrites, Initial: NEGATIVE
Protein, ur: NEGATIVE
RBC / HPF: NONE SEEN /HPF (ref 0–2)
Specific Gravity, Urine: 1.018 (ref 1.001–1.03)
Squamous Epithelial / HPF: NONE SEEN /HPF (ref <=5)
WBC, UA: NONE SEEN /HPF (ref 0–5)
pH: 5.5 (ref 5.0–8.0)

## 2020-06-21 LAB — CBC WITH DIFFERENTIAL/PLATELET
Absolute Monocytes: 391 cells/uL (ref 200–950)
Basophils Absolute: 19 cells/uL (ref 0–200)
Basophils Relative: 0.3 %
Eosinophils Absolute: 130 cells/uL (ref 15–500)
Eosinophils Relative: 2.1 %
HCT: 47.7 % (ref 38.5–50.0)
Hemoglobin: 16.2 g/dL (ref 13.2–17.1)
Lymphs Abs: 1488 cells/uL (ref 850–3900)
MCH: 31.3 pg (ref 27.0–33.0)
MCHC: 34 g/dL (ref 32.0–36.0)
MCV: 92.1 fL (ref 80.0–100.0)
MPV: 9.7 fL (ref 7.5–12.5)
Monocytes Relative: 6.3 %
Neutro Abs: 4173 cells/uL (ref 1500–7800)
Neutrophils Relative %: 67.3 %
Platelets: 161 10*3/uL (ref 140–400)
RBC: 5.18 10*6/uL (ref 4.20–5.80)
RDW: 13.1 % (ref 11.0–15.0)
Total Lymphocyte: 24 %
WBC: 6.2 10*3/uL (ref 3.8–10.8)

## 2020-06-21 LAB — COMPLETE METABOLIC PANEL WITH GFR
AG Ratio: 1.8 (calc) (ref 1.0–2.5)
ALT: 23 U/L (ref 9–46)
AST: 19 U/L (ref 10–35)
Albumin: 4.4 g/dL (ref 3.6–5.1)
Alkaline phosphatase (APISO): 75 U/L (ref 35–144)
BUN: 16 mg/dL (ref 7–25)
CO2: 22 mmol/L (ref 20–32)
Calcium: 9.1 mg/dL (ref 8.6–10.3)
Chloride: 108 mmol/L (ref 98–110)
Creat: 1.07 mg/dL (ref 0.70–1.25)
GFR, Est African American: 86 mL/min/{1.73_m2} (ref >=60)
GFR, Est Non African American: 75 mL/min/{1.73_m2} (ref >=60)
Globulin: 2.4 g/dL (calc) (ref 1.9–3.7)
Glucose, Bld: 92 mg/dL (ref 65–99)
Potassium: 4.3 mmol/L (ref 3.5–5.3)
Sodium: 141 mmol/L (ref 135–146)
Total Bilirubin: 0.6 mg/dL (ref 0.2–1.2)
Total Protein: 6.8 g/dL (ref 6.1–8.1)

## 2020-06-21 LAB — MICROALBUMIN / CREATININE URINE RATIO
Creatinine, Urine: 116 mg/dL (ref 20–320)
Microalb Creat Ratio: 2 mcg/mg creat (ref <30)
Microalb, Ur: 0.2 mg/dL

## 2020-06-21 LAB — HEMOGLOBIN A1C
Hgb A1c MFr Bld: 5.4 % of total Hgb (ref <5.7)
Mean Plasma Glucose: 108 (calc)
eAG (mmol/L): 6 (calc)

## 2020-06-21 LAB — IRON, TOTAL/TOTAL IRON BINDING CAP
%SAT: 26 % (calc) (ref 20–48)
Iron: 92 ug/dL (ref 50–180)
TIBC: 360 mcg/dL (calc) (ref 250–425)

## 2020-06-21 LAB — VITAMIN D 25 HYDROXY (VIT D DEFICIENCY, FRACTURES): Vit D, 25-Hydroxy: 66 ng/mL (ref 30–100)

## 2020-06-21 LAB — LIPID PANEL
Cholesterol: 120 mg/dL (ref <200)
HDL: 51 mg/dL (ref >=40)
LDL Cholesterol (Calc): 47 mg/dL (calc)
Non-HDL Cholesterol (Calc): 69 mg/dL (calc) (ref <130)
Total CHOL/HDL Ratio: 2.4 (calc) (ref <5.0)
Triglycerides: 132 mg/dL (ref <150)

## 2020-06-21 LAB — VITAMIN B12: Vitamin B-12: 537 pg/mL (ref 200–1100)

## 2020-06-21 LAB — NO CULTURE INDICATED

## 2020-06-21 LAB — TSH: TSH: 1.46 mIU/L (ref 0.40–4.50)

## 2020-06-21 LAB — PSA: PSA: 0.7 ng/mL (ref <=4.0)

## 2020-06-21 LAB — MAGNESIUM: Magnesium: 2.1 mg/dL (ref 1.5–2.5)

## 2020-07-04 ENCOUNTER — Other Ambulatory Visit: Payer: Self-pay | Admitting: Internal Medicine

## 2020-07-22 ENCOUNTER — Other Ambulatory Visit (INDEPENDENT_AMBULATORY_CARE_PROVIDER_SITE_OTHER): Payer: 59

## 2020-07-22 ENCOUNTER — Other Ambulatory Visit: Payer: Self-pay

## 2020-07-22 DIAGNOSIS — Z79899 Other long term (current) drug therapy: Secondary | ICD-10-CM | POA: Diagnosis not present

## 2020-07-22 DIAGNOSIS — B351 Tinea unguium: Secondary | ICD-10-CM

## 2020-07-22 LAB — COMPLETE METABOLIC PANEL WITH GFR
AG Ratio: 1.9 (calc) (ref 1.0–2.5)
ALT: 20 U/L (ref 9–46)
AST: 20 U/L (ref 10–35)
Albumin: 4.1 g/dL (ref 3.6–5.1)
Alkaline phosphatase (APISO): 73 U/L (ref 35–144)
BUN: 17 mg/dL (ref 7–25)
CO2: 26 mmol/L (ref 20–32)
Calcium: 8.7 mg/dL (ref 8.6–10.3)
Chloride: 107 mmol/L (ref 98–110)
Creat: 1.18 mg/dL (ref 0.70–1.25)
GFR, Est African American: 77 mL/min/{1.73_m2} (ref >=60)
GFR, Est Non African American: 66 mL/min/{1.73_m2} (ref >=60)
Globulin: 2.2 g/dL (calc) (ref 1.9–3.7)
Glucose, Bld: 93 mg/dL (ref 65–99)
Potassium: 4.2 mmol/L (ref 3.5–5.3)
Sodium: 140 mmol/L (ref 135–146)
Total Bilirubin: 0.6 mg/dL (ref 0.2–1.2)
Total Protein: 6.3 g/dL (ref 6.1–8.1)

## 2020-09-15 NOTE — Progress Notes (Signed)
Assessment and Plan:  Barry Miles was seen today for dizziness.  Diagnoses and all orders for this visit:  Benign paroxysmal positional vertigo due to bilateral vestibular disorder -     meclizine (ANTIVERT) 25 MG tablet; 1/2-1 pill up to 3 times daily for motion sickness/dizziness Discussed changing loratadine n to cetirizine at night. Provided printed instruction on Epley manuver  Essential hypertension Lifestyle controlled Monitor blood pressure at home; call if consistently over 130/80 Continue DASH diet.   Reminder to go to the ER if any CP, SOB, nausea, dizziness, severe HA, changes vision/speech, left arm numbness and tingling and jaw pain.  Gastroesophageal reflux disease with esophagitis without hemorrhage Doing well at this time Continue:  Diet discussed Monitor for triggers Avoid food with high acid content Avoid excessive cafeine Increase water intake  Toenail fungus Continue to  Monitor No longer taking terbinafine related to adverse reaction  Mixed hyperlipidemia Continue medications: rosuvastatin 10mg  nightly Discussed dietary and exercise modifications Low fat diet  BMI 34.0-34.9,adult Discussed dietary and exercise modifications   Call or return with any new or worsening symptoms.  Discussed hospital precautions with patient including but not limited to change in vision, weakness one side of body, chest pain or shortness of breath. Discussed hospital precautions with patient.    Follow up as needed.  Further disposition pending results if labs check today. Discussed med's effects and SE's.   Over 30 minutes of face to face interview, exam, counseling, chart review, and critical decision making was performed.   Future Appointments  Date Time Provider [place]  06/24/2021  9:00 AM Kannon Baum, Danton Sewer, NP [facility] None    ------------------------------------------------------------------------------------------------------------------   HPI 64  y.o.male presents for persistent vertigo.  Reports this has been going on for over the past month.  Reports that he is having dizzy spells that are intermittent in nature.  He has history of allergies.  He has tried Claritin D and that has worked fairly well.  He is also having vertigo induced by neck position.  He reports extension and rotation of neck can cause vertigo at times.  He was last seen in our office on 06/20/20 for routine follow up.  He started treatment for toenail fungus, terbinafine 250mg  daily.  He reports shortly after this he started to develope a rash.  He did not contact our office but he did stop the medication and the rash resolved.   Past Medical History:  Diagnosis Date   Arthritis    Arthritis    Carpal tunnel syndrome on both sides    Erectile dysfunction    GERD (gastroesophageal reflux disease)    Seizure (HCC)    Vertigo      Allergies  Allergen Reactions   Terbinafine And Related Rash    Current Outpatient Medications on File Prior to Visit  Medication Sig   aspirin EC 81 MG tablet Take 1 tablet (81 mg total) by mouth daily.   Cholecalciferol (VITAMIN D3) 2000 UNITS TABS Take 3 capsules by mouth daily.    Magnesium 250 MG TABS Take 0.8 tablets (200 mg total) by mouth daily.   Multiple Vitamins-Minerals (MULTIVITAMIN WITH MINERALS) tablet Take 1 tablet by mouth daily.   RABEprazole (ACIPHEX) 20 MG tablet TAKE 1 TABLET DAILY FOR INDIGESTION & ACID REFLUX   tadalafil (CIALIS) 20 MG tablet Take 1 tablet (20 mg total) by mouth daily as needed for erectile dysfunction.   vitamin C (ASCORBIC ACID) 500 MG tablet Take 500 mg by mouth daily.  rosuvastatin (CRESTOR) 10 MG tablet Take 1 tablet (10 mg total) by mouth daily.   No current facility-administered medications on file prior to visit.    ROS: all negative except above.   Physical Exam:  BP 126/72    Pulse (!) 103    Temp 97.6 F (36.4 C)    Wt 225 lb (102.1 kg)    SpO2 97%    BMI  34.21 kg/m   General Appearance: Well nourished, in no apparent distress. Eyes: PERRLA, EOMs, conjunctiva no swelling or erythema Sinuses: No Frontal/maxillary tenderness ENT/Mouth: Ext aud canals clear, TMs without erythema, bulging. No erythema, swelling, or exudate on post pharynx.  Tonsils not swollen or erythematous. Hearing normal.  Neck: Supple, thyroid normal.  Respiratory: Respiratory effort normal, BS equal bilaterally without rales, rhonchi, wheezing or stridor.  Cardio: RRR with no MRGs. Brisk peripheral pulses without edema.  Abdomen: Soft, + BS.  Non tender, no guarding, rebound, hernias, masses. Lymphatics: Non tender without lymphadenopathy.  Musculoskeletal: Full ROM, 5/5 strength, normal gait.  Skin: Warm, dry without rashes, lesions, ecchymosis.  Neuro: Cranial nerves intact. Normal muscle tone, no cerebellar symptoms. Sensation intact.  Psych: Awake and oriented X 3, normal affect, Insight and Judgment appropriate.  Dix-Hallpike: positive    Garnet Sierras, NP 5:54 PM [facility]

## 2020-09-16 ENCOUNTER — Other Ambulatory Visit: Payer: Self-pay

## 2020-09-16 ENCOUNTER — Ambulatory Visit (INDEPENDENT_AMBULATORY_CARE_PROVIDER_SITE_OTHER): Payer: 59 | Admitting: Adult Health Nurse Practitioner

## 2020-09-16 ENCOUNTER — Encounter: Payer: Self-pay | Admitting: Adult Health Nurse Practitioner

## 2020-09-16 VITALS — BP 126/72 | HR 103 | Temp 97.6°F | Wt 225.0 lb

## 2020-09-16 DIAGNOSIS — H8113 Benign paroxysmal vertigo, bilateral: Secondary | ICD-10-CM | POA: Diagnosis not present

## 2020-09-16 DIAGNOSIS — B351 Tinea unguium: Secondary | ICD-10-CM | POA: Diagnosis not present

## 2020-09-16 DIAGNOSIS — K2100 Gastro-esophageal reflux disease with esophagitis, without bleeding: Secondary | ICD-10-CM

## 2020-09-16 DIAGNOSIS — Z6834 Body mass index (BMI) 34.0-34.9, adult: Secondary | ICD-10-CM

## 2020-09-16 DIAGNOSIS — E782 Mixed hyperlipidemia: Secondary | ICD-10-CM

## 2020-09-16 DIAGNOSIS — I10 Essential (primary) hypertension: Secondary | ICD-10-CM

## 2020-09-16 MED ORDER — MECLIZINE HCL 25 MG PO TABS: ORAL_TABLET | ORAL | 0 refills | Status: DC

## 2020-09-16 NOTE — Patient Instructions (Addendum)
Look up videos from a [facility].   Benign Paroxysmal Positional Vertigo (BPPV)  General Information In Benign Paroxysmal Positional Vertigo (BPPV) dizziness is generally thought to be due to debris which has collected within a part of the inner ear. This debris can be thought of as "ear rocks", although the formal name is "otoconia". Ear rocks are small crystals of calcium carbonate derived from a structure in the ear called the "utricle" (figure to the right ). The symptoms of BPPV include dizziness or vertigo, lightheadedness, imbalance, and nausea. Activities which bring on symptoms will vary among persons, but symptoms are usually followed by a change of position of the head like getting out of bed or rolling over in bed are common "problem" motions .  However if you have worsening HA, changes vision/speech, weakness go to the ER     What can be done? 1) Use two or more pillows at night. Avoid sleeping on the "bad" side. In the morning, get up slowly and sit on the edge of the bed for a minute. 2) Medication prescribed by your doctor.  3) The exercises below, you can do at home to help you prevent the sensation later in the day.  4) We can refer you to physical therapy at [facility], # [place] treatments: The Brandt-Daroff Exercises are a home method of treating BPPV,and are effective 95% of the time.  These exercises are performed in three sets per day for two weeks. In each set, one performs the maneuver as shown five times. Start sitting upright (position 1). Then move into the side-lying position (position 2), with the head angled upward about halfway. An easy way to remember this is to imagine someone standing about 6 feet in front of you, and just keep looking at their head at all times. Stay in the side-lying position for 30 seconds, or until the dizziness subsides if this is longer, then go back to the sitting position (position  3). Stay there for 30 seconds, and then go to the opposite side (position 4) and follow the same routine.  At home Epley Maneuver This procedure seems to be even more effective than the in-office procedure, perhaps because it is repeated every night for a week.  The method (for the left side) is performed as shown on the figure below.  1) One stays in each of the supine (lying down) positions for 30 seconds, and in the sitting upright position (top) for 1 minute.  2) Thus, once cycle takes 2 1/2 minutes.  3) Typically 3 cycles are performed just prior to going to sleep.  4) It is best to do them at night rather than in the morning or midday, as if one becomes dizzy following the exercises, then it can resolve while one is sleeping.  The mirror image of this procedure is used for the right ear.

## 2020-12-17 IMAGING — CT CT HEART MORP W/ CTA COR W/ SCORE W/ CA W/CM &/OR W/O CM
4 of 7 series · 8 of 20 positions shown, 9 images · non-contrast
Comparison: None.
COMPARISON: None.

Addendum:
EXAM:
OVER-READ INTERPRETATION  CT CHEST

The following report is an over-read performed by radiologist Dr.
Hurensoohn Bosseler [REDACTED] on 09/04/2019. This over-read
does not include interpretation of cardiac or coronary anatomy or
pathology. The coronary CTA interpretation by the cardiologist is
attached.
CLINICAL DATA: Chest pain/Syncope
Cardiac/Coronary CTA
TECHNIQUE: The patient was scanned on a Phillips Force scanner. A 100 kV
prospective scan was triggered in the descending thoracic aorta at
111 HU's. Axial non-contrast 3 mm slices were carried out through
the heart. The data set was analyzed on a dedicated work station and
scored using the Agatson method. Gantry rotation speed was 250 msecs
and collimation was .6 mm. No beta blockade and 0.8 mg of sl NTG was
given. The 3D data set was reconstructed in 5% intervals of the
35-75 % of the R-R cycle. Diastolic phases were analyzed on a
dedicated work station using MPR, MIP and VRT modes. The patient
received 80 cc of contrast.

[Series 6: best diast 69 % · axial · 0.39mm/px · z∈[-334,-255]mm · 2 of 591 slices shown, 3 images]
[im 197/591  vessel]
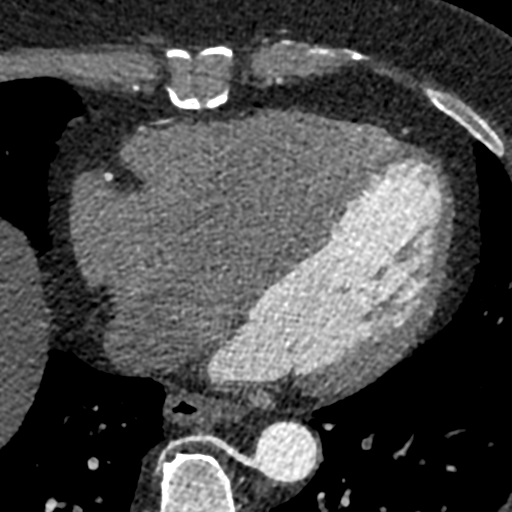
[im 197/591  lung]
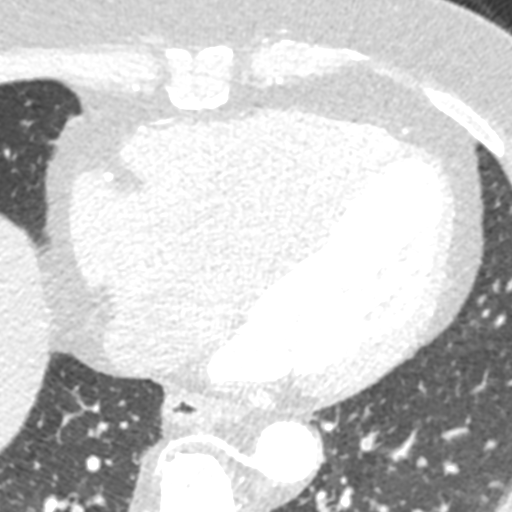
[im 394/591  vessel]
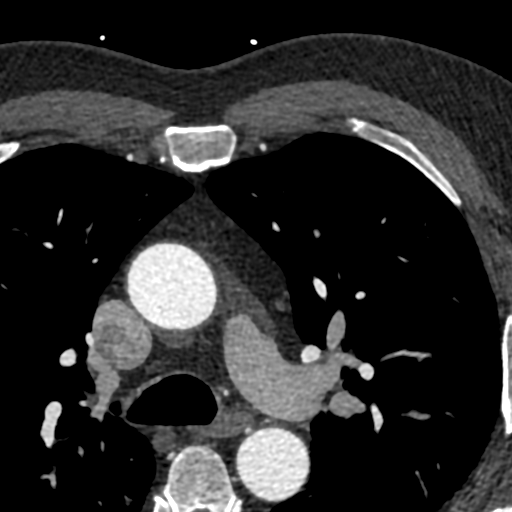

[Series 7: best syst 69 % · axial · 0.39mm/px · z∈[-334,-255]mm · 2 of 591 slices shown]
[im 197/591  vessel]
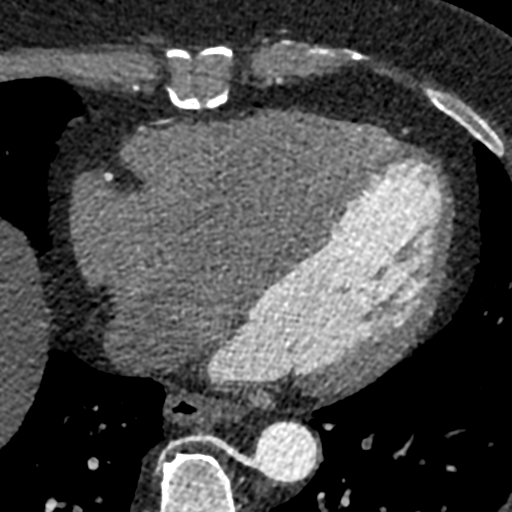
[im 394/591  vessel]
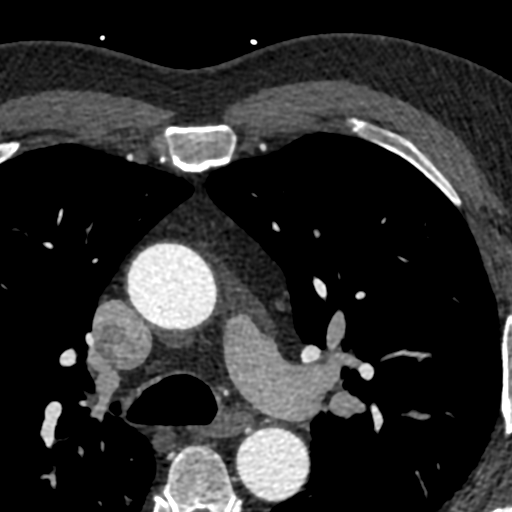

[Series 8: ts diast sharp 69 % · axial · 0.39mm/px · z∈[-334,-255]mm · 2 of 591 slices shown]
[im 197/591  lung]
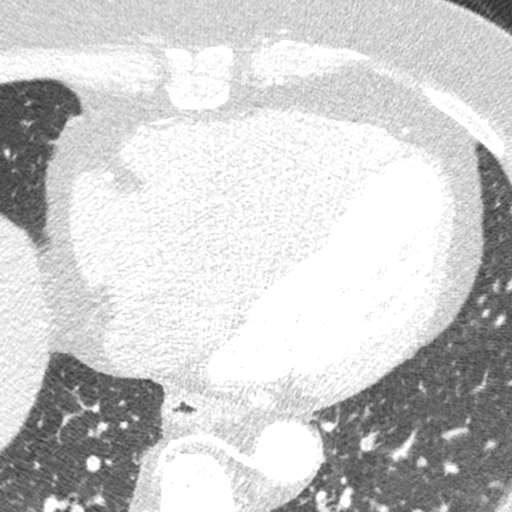
[im 394/591  lung]
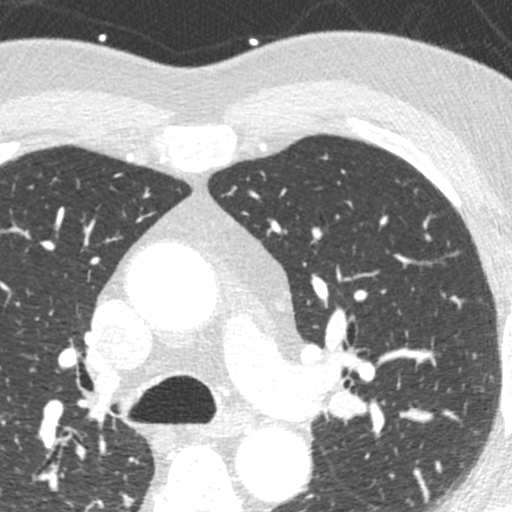

[Series 9: ts syst sharp 69 % · axial · 0.39mm/px · z∈[-334,-255]mm · 2 of 591 slices shown]
[im 197/591  lung]
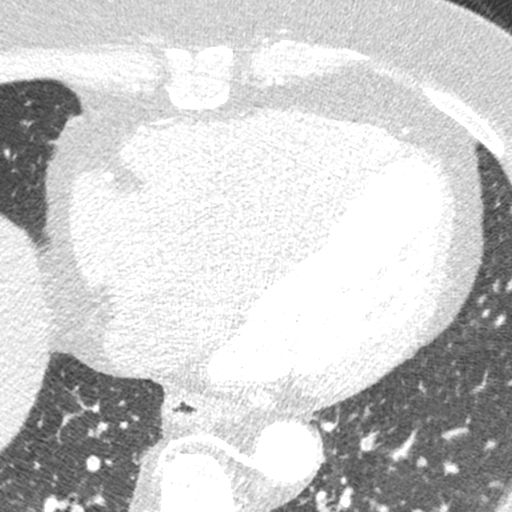
[im 394/591  lung]
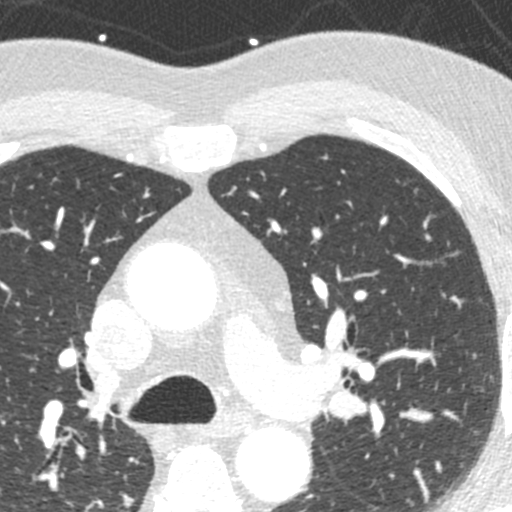

[8 of 20 positions shown; findings below may reference images not displayed]

FINDINGS: Vascular: Heart is mildly enlarged. Aorta is normal caliber.
Scattered aortic calcifications within the arch.

Mediastinum/Nodes: Small hiatal hernia. No mediastinal, hilar, or
axillary adenopathy. Trachea and esophagus are unremarkable.

Lungs/Pleura: No confluent airspace opacities or effusions.

Upper Abdomen: Multiple gallstones within the gallbladder. Suspect
diffuse fatty infiltration of the liver.

Musculoskeletal: Chest wall soft tissues are unremarkable. No acute
bony abnormality.
IMPRESSION: Small hiatal hernia.

Cholelithiasis.

Suspect fatty infiltration of the liver.

No acute extra cardiac abnormality.
FINDINGS: Image quality: Average.

Noise artifact is: Limited. Movement artifact noted.

Coronary Arteries:  Normal coronary origin.  Right dominance.

Left main: The left main is a large caliber vessel with a normal
take off from the left coronary cusp that bifurcates to form a left
anterior descending artery and a left circumflex artery. There is no
plaque or stenosis.

Left anterior descending artery: The proximal LAD contains minimal
non-calcified plaque (<25%). There is a mid to distal myocardial
bridge present. The mid and distal segments are free of plaque or
stenosis. The LAD gives off 2 patent diagonal branches.

Left circumflex artery: The LCX is non-dominant and patent with no
evidence of plaque or stenosis. The LCX gives off 2 patent obtuse
marginal branches.

Right coronary artery: The RCA is dominant with normal take off from
the right coronary cusp. There is a stair step artifact in the
proximal to mid RCA that is likely normal. The RCA is free of plaque
and stenosis. The RCA terminates as a PDA and right posterolateral
branch without evidence of plaque or stenosis.

Right Atrium: Right atrial size is within normal limits.

Right Ventricle: The right ventricular cavity is within normal
limits.

Left Atrium: Left atrial size is normal in size with no left atrial
appendage filling defect.

Left Ventricle: The ventricular cavity size is within normal limits.
There are no stigmata of prior infarction. There is no abnormal
filling defect.

Pulmonary arteries: Normal in size without proximal filling defect.

Pulmonary veins: Normal pulmonary venous drainage.

Pericardium: Normal thickness with no significant effusion or
calcium present.

Cardiac valves: The aortic valve is trileaflet without significant
calcification. The mitral valve is normal structure without
significant calcification.

Aorta: Normal caliber with minimal calcified plaque.

Extra-cardiac findings: See attached radiology report for
non-cardiac structures.
IMPRESSION: 1. Coronary calcium score of 0.

2. Normal coronary origin with right dominance.

3. Minimal non-calcified plaque in the pLAD (<25%) that is
non-obstructive. Normal LCX.

4. Significant stairstep artifact noted from movement, but RCA
appears to be normal without evidence of plaque or stenosis.

5. Myocardial bridge noted in the mid to distal LAD (normal variant)

RECOMMENDATIONS:
1. CAD-RADS 1: Minimal non-obstructive CAD (0-24%). Consider
non-atherosclerotic causes of chest pain. Consider preventive
therapy and risk factor modification.

*** End of Addendum ***
EXAM:
OVER-READ INTERPRETATION  CT CHEST

The following report is an over-read performed by radiologist Dr.
Hurensoohn Bosseler [REDACTED] on 09/04/2019. This over-read
does not include interpretation of cardiac or coronary anatomy or
pathology. The coronary CTA interpretation by the cardiologist is
attached.
FINDINGS: Vascular: Heart is mildly enlarged. Aorta is normal caliber.
Scattered aortic calcifications within the arch.

Mediastinum/Nodes: Small hiatal hernia. No mediastinal, hilar, or
axillary adenopathy. Trachea and esophagus are unremarkable.

Lungs/Pleura: No confluent airspace opacities or effusions.

Upper Abdomen: Multiple gallstones within the gallbladder. Suspect
diffuse fatty infiltration of the liver.

Musculoskeletal: Chest wall soft tissues are unremarkable. No acute
bony abnormality.
IMPRESSION: Small hiatal hernia.

Cholelithiasis.

Suspect fatty infiltration of the liver.

No acute extra cardiac abnormality.

## 2020-12-21 ENCOUNTER — Other Ambulatory Visit: Payer: Self-pay | Admitting: Internal Medicine

## 2020-12-21 ENCOUNTER — Other Ambulatory Visit: Payer: Self-pay | Admitting: Adult Health

## 2021-01-08 ENCOUNTER — Other Ambulatory Visit: Payer: Self-pay | Admitting: Cardiovascular Disease

## 2021-02-01 ENCOUNTER — Other Ambulatory Visit: Payer: Self-pay | Admitting: Cardiovascular Disease

## 2021-03-22 ENCOUNTER — Other Ambulatory Visit: Payer: Self-pay | Admitting: Internal Medicine

## 2021-05-10 ENCOUNTER — Other Ambulatory Visit: Payer: Self-pay | Admitting: Cardiovascular Disease

## 2021-06-08 ENCOUNTER — Other Ambulatory Visit: Payer: Self-pay | Admitting: Cardiovascular Disease

## 2021-06-24 ENCOUNTER — Encounter: Payer: 59 | Admitting: Internal Medicine

## 2021-07-31 NOTE — Progress Notes (Addendum)
Complete Physical  Assessment and Plan:  Barry Miles was seen today for annual exam.  Diagnoses and all orders for this visit:  Encounter for general adult medical examination with abnormal findings  Due Annually  EKG  Essential hypertension -     CBC with Differential/Platelet       - - continue medications, DASH diet, exercise. Advised pt I would like himi to start taking his BP at home and call if greater than 130/80.    Gastroesophageal reflux disease with esophagitis without hemorrhage       - Continue aciphex and dietary modifications  Tachycardia   Symptoms controlled, continue to monitor  Seizure (HCC)  No episodes, continue to monitor  Mixed hyperlipidemia -     COMPLETE METABOLIC PANEL WITH GFR -     Lipid panel  Continue, medication and stressed the importance of diet and exercise  Carpal tunnel syndrome on both sides  Symptoms are worsening, will make appt with Dr. Amedeo Plenty  BMI 34.0-34.9,adult  Long discussion on diet and exercise.  Stressed importance of fresh fruits and vegetables, increasing fiber, decreasing saturated fats and processed foods.  Medication management -     CBC with Differential/Platelet -     COMPLETE METABOLIC PANEL WITH GFR -     Lipid panel -     TSH -     Hemoglobin A1c -     VITAMIN D 25 Hydroxy (Vit-D Deficiency, Fractures) -     Magnesium -     EKG 12-Lead -     Urinalysis, Routine w reflex microscopic -     Microalbumin / creatinine urine ratio -     PSA  Screening for diabetes mellitus -     Hemoglobin A1c  Screening for blood or protein in urine -     Urinalysis, Routine w reflex microscopic -     Microalbumin / creatinine urine ratio  Screening for deficiency anemia  CBC  Screening PSA (prostate specific antigen) -     PSA  Screening, ischemic heart disease -     EKG 12-Lead  Screening for thyroid disorder -     TSH  Screening for AAA (abdominal aortic aneurysm) -     [place], [employer]      Over  40 minutes of face to face interview, exam, counseling, chart review and critical decision making was performed Discussed med's effects and SE's. Screening labs and tests as requested with regular follow-up as recommended.  HPI Patient presents for a complete physical.   He has a history of GERD and taking Aciphex and is controlled.  Pt has a hiatal hernia.   Toenail fungus still present, lamisil had pt break out in hives. Has been trying over the counter topicals.  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  He denies chest pain, shortness of breath, dizziness.   BP Readings from Last 3 Encounters:  08/04/21 136/84  09/16/20 126/72  06/20/20 130/78    Pt carpal tunnel are becoming more numbness and decreased strength, right > left.  Has been followed Dr. Amedeo Plenty.  He is on cholesterol medication and denies myalgias. His cholesterol is at goal. The cholesterol last visit was:   Lab Results  Component Value Date   CHOL 120 06/20/2020   HDL 51 06/20/2020   LDLCALC 47 06/20/2020  TRIG 132 06/20/2020   CHOLHDL 2.4 06/20/2020    He has been working on diet and exercise. Last A1C in the office was:  Lab Results  Component Value Date   HGBA1C 5.4 06/20/2020    Patient is on Vitamin D supplement.   Last vitamin D Lab Results  Component Value Date   VD25OH 1 06/20/2020    Denies BPH symptoms, does need to use Cialis for erectile issues. Last PSA was: Lab Results  Component Value Date   PSA 0.7 06/20/2020   PSA 1.0 05/25/2019   PSA 0.9 03/22/2018     BMI is Body mass index is 34.45 kg/m., he is working on diet and exercise. Diet has not been controlled, eating more processed foods.  Limited exercise Wt Readings from Last 3 Encounters:  08/04/21 226 lb 9.6 oz (102.8 kg)  09/16/20 225 lb (102.1 kg)  06/20/20 224 lb (101.6 kg)     Current Medications:  Current Outpatient Medications on File Prior to Visit  Medication Sig Dispense Refill   Cholecalciferol (VITAMIN D3) 2000 UNITS TABS Take 3 capsules by mouth daily.      Magnesium 250 MG TABS Take 0.8 tablets (200 mg total) by mouth daily. 30 tablet 1   meclizine (ANTIVERT) 25 MG tablet 1/2-1 pill up to 3 times daily for motion sickness/dizziness 30 tablet 0   Multiple Vitamins-Minerals (MULTIVITAMIN WITH MINERALS) tablet Take 1 tablet by mouth daily.     RABEprazole (ACIPHEX) 20 MG tablet Take  1 tablet  Daily  to Prevent  Indigestion & Heartburn 90 tablet 1   rosuvastatin (CRESTOR) 10 MG tablet TAKE 1 TABLET BY MOUTH EVERY DAY (Patient taking differently: TAKE 1 TABLET BY MOUTH EVERY DAY  Is taking one every other day and not on the weekends 08/04/21.) 30 tablet 1   vitamin C (ASCORBIC ACID) 500 MG tablet Take 500 mg by mouth daily.     No current facility-administered medications on file prior to visit.    Health Maintenance:  Immunization History  Administered Date(s) Administered   Influenza Inj Mdck Quad Pf 10/04/2019   Influenza-Unspecified 09/06/2013, 09/27/2018, 10/04/2019   PFIZER(Purple Top)SARS-COV-2 Vaccination 12/09/2020   PPD Test 03/02/2014   Td 08/27/2005   Tdap 02/01/2013   Tetanus: 2014 Flu vaccine: 2021- gets at work Colonoscopy: 2019, Dr. Fuller Plan, 3-5years Eye Exam:  Dr. Peter Garter , Glasses, goes yearly, due for 11/2021 Dentist: Dr. Reola Calkins    Pneumovax: age 3 Shingrix: pt to call insurance to see if they cover  Patient Care Team: Unk Pinto, MD as PCP - General (Internal Medicine) Rutherford Guys, MD as Consulting Physician (Ophthalmology) Fay Records, MD as Consulting Physician (Cardiology) Ladene Artist, MD as Consulting Physician (Gastroenterology) Franchot Gallo, MD as Consulting Physician (Urology) Roseanne Kaufman, MD as Consulting Physician (Orthopedic Surgery)  Medical History:  Past Medical History:  Diagnosis  Date   Arthritis    Arthritis    Carpal tunnel syndrome on both sides    Erectile dysfunction    GERD (gastroesophageal reflux disease)    Seizure (HCC)    Vertigo    Allergies Allergies  Allergen Reactions   Terbinafine And Related Rash    SURGICAL HISTORY He  has a past surgical history that includes Tonsillectomy; Colonoscopy; Polypectomy; and Wisdom tooth extraction. FAMILY HISTORY His family history includes Breast cancer in his mother; Colon polyps in his maternal aunt and maternal uncle; Diabetes in his father, maternal grandmother, mother, and paternal grandmother; Heart disease in his father. SOCIAL  HISTORY He  reports that he has never smoked. He has never used smokeless tobacco. He reports that he does not drink alcohol and does not use drugs.   Review of Systems:  Review of Systems  Constitutional:  Negative for chills, fever and malaise/fatigue.  HENT:  Positive for hearing loss (high frequency) and tinnitus. Negative for congestion, ear pain, sinus pain and sore throat.   Eyes: Negative.  Negative for blurred vision and double vision.       Floater in right eye  Respiratory:  Negative for cough, hemoptysis, sputum production, shortness of breath and wheezing.   Cardiovascular:  Negative for chest pain, palpitations and leg swelling.  Gastrointestinal:  Positive for heartburn. Negative for abdominal pain, blood in stool, constipation, diarrhea, melena, nausea and vomiting.  Genitourinary: Negative.  Negative for dysuria and urgency.  Musculoskeletal:  Positive for back pain and joint pain. Negative for falls, myalgias and neck pain.  Skin:  Negative for rash.  Neurological:  Positive for tingling (right hand) and weakness (left hand). Negative for dizziness, tremors, sensory change, loss of consciousness and headaches.  Endo/Heme/Allergies:  Does not bruise/bleed easily.  Psychiatric/Behavioral:  Negative for depression and suicidal ideas. The patient is not  nervous/anxious and does not have insomnia.    Physical Exam: Estimated body mass index is 34.45 kg/m as calculated from the following:   Height as of 06/20/20: '5\' 8"'$  (1.727 m).   Weight as of this encounter: 226 lb 9.6 oz (102.8 kg). BP 136/84   Pulse 78   Temp (!) 97.5 F (36.4 C)   Wt 226 lb 9.6 oz (102.8 kg)   SpO2 96%   BMI 34.45 kg/m   General Appearance: Well nourished, in no apparent distress.  Eyes: PERRLA, EOMs, conjunctiva no swelling or erythema ENT/Mouth: Ear canals some soft wax noted with no erythema, swelling, discharge.  TMs normal bilaterally with no erythema, bulging, or retractions.  Oropharynx clear and moist with no exudate, swelling, or erythema.  Dentition normal.   Neck: Supple, thyroid normal. No bruits, JVD, cervical adenopathy Respiratory: Respiratory effort normal, BS equal bilaterally without rales, rhonchi, wheezing or stridor.  Cardio: RRR without murmurs, rubs or gallops. Brisk peripheral pulses without edema.  Chest: symmetric, with normal excursions Abdomen: Soft, nontender, no guarding, rebound, hernias, masses, or organomegaly. Genitourinary: Patient preference to defer to Dr. Melford Aase or Dr. Payton Mccallum for prostate exam Musculoskeletal: Full ROM all peripheral extremities,5/5 strength, and normal gait.  Skin: Warm, dry without rashes, lesions, ecchymosis. 4-5 scattered sebborrheic keratoses located on the back Neuro: A&Ox3, Cranial nerves intact, reflexes equal bilaterally. Normal muscle tone, no cerebellar symptoms. Sensation intact.  Psych: Normal affect, Insight and Judgment appropriate.   EKG: Sinus rhythm w/ no ST changes AAA: <3 cm  Barry Miles Kathyrn Drown, NP 09:45 AM [facility]

## 2021-08-03 ENCOUNTER — Other Ambulatory Visit: Payer: Self-pay | Admitting: Cardiovascular Disease

## 2021-08-04 ENCOUNTER — Other Ambulatory Visit: Payer: Self-pay

## 2021-08-04 ENCOUNTER — Encounter: Payer: Self-pay | Admitting: Nurse Practitioner

## 2021-08-04 ENCOUNTER — Ambulatory Visit (INDEPENDENT_AMBULATORY_CARE_PROVIDER_SITE_OTHER): Payer: 59 | Admitting: Nurse Practitioner

## 2021-08-04 VITALS — BP 136/84 | HR 78 | Temp 97.5°F | Wt 226.6 lb

## 2021-08-04 DIAGNOSIS — I10 Essential (primary) hypertension: Secondary | ICD-10-CM | POA: Diagnosis not present

## 2021-08-04 DIAGNOSIS — Z131 Encounter for screening for diabetes mellitus: Secondary | ICD-10-CM

## 2021-08-04 DIAGNOSIS — Z0001 Encounter for general adult medical examination with abnormal findings: Secondary | ICD-10-CM

## 2021-08-04 DIAGNOSIS — Z0000 Encounter for general adult medical examination without abnormal findings: Secondary | ICD-10-CM

## 2021-08-04 DIAGNOSIS — Z125 Encounter for screening for malignant neoplasm of prostate: Secondary | ICD-10-CM | POA: Diagnosis not present

## 2021-08-04 DIAGNOSIS — R000 Tachycardia, unspecified: Secondary | ICD-10-CM

## 2021-08-04 DIAGNOSIS — E559 Vitamin D deficiency, unspecified: Secondary | ICD-10-CM

## 2021-08-04 DIAGNOSIS — Z79899 Other long term (current) drug therapy: Secondary | ICD-10-CM | POA: Diagnosis not present

## 2021-08-04 DIAGNOSIS — Z136 Encounter for screening for cardiovascular disorders: Secondary | ICD-10-CM | POA: Diagnosis not present

## 2021-08-04 DIAGNOSIS — R350 Frequency of micturition: Secondary | ICD-10-CM | POA: Diagnosis not present

## 2021-08-04 DIAGNOSIS — R569 Unspecified convulsions: Secondary | ICD-10-CM

## 2021-08-04 DIAGNOSIS — Z13220 Encounter for screening for lipoid disorders: Secondary | ICD-10-CM

## 2021-08-04 DIAGNOSIS — Z1329 Encounter for screening for other suspected endocrine disorder: Secondary | ICD-10-CM

## 2021-08-04 DIAGNOSIS — Z1389 Encounter for screening for other disorder: Secondary | ICD-10-CM | POA: Diagnosis not present

## 2021-08-04 DIAGNOSIS — E782 Mixed hyperlipidemia: Secondary | ICD-10-CM

## 2021-08-04 DIAGNOSIS — N529 Male erectile dysfunction, unspecified: Secondary | ICD-10-CM

## 2021-08-04 DIAGNOSIS — N401 Enlarged prostate with lower urinary tract symptoms: Secondary | ICD-10-CM | POA: Diagnosis not present

## 2021-08-04 DIAGNOSIS — Z130 Encounter for screening for diseases of the blood and blood-forming organs and certain disorders involving the immune mechanism: Secondary | ICD-10-CM

## 2021-08-04 DIAGNOSIS — G5603 Carpal tunnel syndrome, bilateral upper limbs: Secondary | ICD-10-CM

## 2021-08-04 DIAGNOSIS — K2100 Gastro-esophageal reflux disease with esophagitis, without bleeding: Secondary | ICD-10-CM

## 2021-08-04 DIAGNOSIS — Z6834 Body mass index (BMI) 34.0-34.9, adult: Secondary | ICD-10-CM

## 2021-08-04 MED ORDER — TADALAFIL 20 MG PO TABS: 20.0000 mg | ORAL_TABLET | Freq: Every day | ORAL | 2 refills | Status: DC | PRN

## 2021-08-04 NOTE — Patient Instructions (Signed)
GENERAL HEALTH GOALS   Know what a healthy weight is for you (roughly BMI <25) and aim to maintain this   Aim for 7+ servings of fruits and vegetables daily   70-80+ fluid ounces of water or unsweet tea for healthy kidneys   Limit to max 1 drink of alcohol per day; avoid smoking/tobacco   Limit animal fats in diet for cholesterol and heart health - choose grass fed whenever available   Avoid highly processed foods, and foods high in saturated/trans fats   Aim for low stress - take time to unwind and care for your mental health   Aim for 150 min of moderate intensity exercise weekly for heart health, and weights twice weekly for bone health   Aim for 7-9 hours of sleep daily   Hypertension, Adult High blood pressure (hypertension) is when the force of blood pumping through the arteries is too strong. The arteries are the blood vessels that carry blood from the heart throughout the body. Hypertension forces the heart to work harder to pump blood and may cause arteries to become narrow or stiff. Untreated or uncontrolled hypertension can cause a heart attack, heart failure, a stroke, kidney disease, and otherproblems. A blood pressure reading consists of a higher number over a lower number. Ideally, your blood pressure should be below 120/80. The first ("top") number is called the systolic pressure. It is a measure of the pressure in your arteries as your heart beats. The second ("bottom") number is called the diastolic pressure. It is a measure of the pressure in your arteries as theheart relaxes. What are the causes? The exact cause of this condition is not known. There are some conditions thatresult in or are related to high blood pressure. What increases the risk? Some risk factors for high blood pressure are under your control. The following factors may make you more likely to develop this condition: Smoking. Having type 2 diabetes mellitus, high cholesterol, or both. Not getting  enough exercise or physical activity. Being overweight. Having too much fat, sugar, calories, or salt (sodium) in your diet. Drinking too much alcohol. Some risk factors for high blood pressure may be difficult or impossible to change. Some of these factors include: Having chronic kidney disease. Having a family history of high blood pressure. Age. Risk increases with age. Race. You may be at higher risk if you are African American. Gender. Men are at higher risk than women before age 81. After age 29, women are at higher risk than men. Having obstructive sleep apnea. Stress. What are the signs or symptoms? High blood pressure may not cause symptoms. Very high blood pressure (hypertensive crisis) may cause: Headache. Anxiety. Shortness of breath. Nosebleed. Nausea and vomiting. Vision changes. Severe chest pain. Seizures. How is this diagnosed? This condition is diagnosed by measuring your blood pressure while you are seated, with your arm resting on a flat surface, your legs uncrossed, and your feet flat on the floor. The cuff of the blood pressure monitor will be placed directly against the skin of your upper arm at the level of your heart. It should be measured at least twice using the same arm. Certain conditions cancause a difference in blood pressure between your right and left arms. Certain factors can cause blood pressure readings to be lower or higher than normal for a short period of time: When your blood pressure is higher when you are in a health care provider's office than when you are at home, this is called  white coat hypertension. Most people with this condition do not need medicines. When your blood pressure is higher at home than when you are in a health care provider's office, this is called masked hypertension. Most people with this condition may need medicines to control blood pressure. If you have a high blood pressure reading during one visit or you have normal blood  pressure with other risk factors, you may be asked to: Return on a different day to have your blood pressure checked again. Monitor your blood pressure at home for 1 week or longer. If you are diagnosed with hypertension, you may have other blood or imaging tests to help your health care provider understand your overall risk for otherconditions. How is this treated? This condition is treated by making healthy lifestyle changes, such as eating healthy foods, exercising more, and reducing your alcohol intake. Your health care provider may prescribe medicine if lifestyle changes are not enough to get your blood pressure under control, and if: Your systolic blood pressure is above 130. Your diastolic blood pressure is above 80. Your personal target blood pressure may vary depending on your medicalconditions, your age, and other factors. Follow these instructions at home: Eating and drinking  Eat a diet that is high in fiber and potassium, and low in sodium, added sugar, and fat. An example eating plan is called the DASH (Dietary Approaches to Stop Hypertension) diet. To eat this way: Eat plenty of fresh fruits and vegetables. Try to fill one half of your plate at each meal with fruits and vegetables. Eat whole grains, such as whole-wheat pasta, brown rice, or whole-grain bread. Fill about one fourth of your plate with whole grains. Eat or drink low-fat dairy products, such as skim milk or low-fat yogurt. Avoid fatty cuts of meat, processed or cured meats, and poultry with skin. Fill about one fourth of your plate with lean proteins, such as fish, chicken without skin, beans, eggs, or tofu. Avoid pre-made and processed foods. These tend to be higher in sodium, added sugar, and fat. Reduce your daily sodium intake. Most people with hypertension should eat less than 1,500 mg of sodium a day. Do not drink alcohol if: Your health care provider tells you not to drink. You are pregnant, may be pregnant, or  are planning to become pregnant. If you drink alcohol: Limit how much you use to: 0-1 drink a day for women. 0-2 drinks a day for men. Be aware of how much alcohol is in your drink. In the [place] one drink equals one 12 oz bottle of beer (355 mL), one 5 oz glass of wine (148 mL), or one 1 oz glass of hard liquor (44 mL).  Lifestyle  Work with your health care provider to maintain a healthy body weight or to lose weight. Ask what an ideal weight is for you. Get at least 30 minutes of exercise most days of the week. Activities may include walking, swimming, or biking. Include exercise to strengthen your muscles (resistance exercise), such as Pilates or lifting weights, as part of your weekly exercise routine. Try to do these types of exercises for 30 minutes at least 3 days a week. Do not use any products that contain nicotine or tobacco, such as cigarettes, e-cigarettes, and chewing tobacco. If you need help quitting, ask your health care provider. Monitor your blood pressure at home as told by your health care provider. Keep all follow-up visits as told by your health care provider. This is important.  Medicines  Take over-the-counter and prescription medicines only as told by your health care provider. Follow directions carefully. Blood pressure medicines must be taken as prescribed. Do not skip doses of blood pressure medicine. Doing this puts you at risk for problems and can make the medicine less effective. Ask your health care provider about side effects or reactions to medicines that you should watch for. Contact a health care provider if you: Think you are having a reaction to a medicine you are taking. Have headaches that keep coming back (recurring). Feel dizzy. Have swelling in your ankles. Have trouble with your vision. Get help right away if you: Develop a severe headache or confusion. Have unusual weakness or numbness. Feel faint. Have severe pain in your chest or  abdomen. Vomit repeatedly. Have trouble breathing. Summary Hypertension is when the force of blood pumping through your arteries is too strong. If this condition is not controlled, it may put you at risk for serious complications. Your personal target blood pressure may vary depending on your medical conditions, your age, and other factors. For most people, a normal blood pressure is less than 120/80. Hypertension is treated with lifestyle changes, medicines, or a combination of both. Lifestyle changes include losing weight, eating a healthy, low-sodium diet, exercising more, and limiting alcohol. This information is not intended to replace advice given to you by your health care provider. Make sure you discuss any questions you have with your healthcare provider. Document Revised: 08/03/2018 Document Reviewed: 08/03/2018 Elsevier Patient Education  [place].

## 2021-08-05 LAB — CBC WITH DIFFERENTIAL/PLATELET
Absolute Monocytes: 281 cells/uL (ref 200–950)
Basophils Absolute: 21 cells/uL (ref 0–200)
Basophils Relative: 0.4 %
Eosinophils Absolute: 62 cells/uL (ref 15–500)
Eosinophils Relative: 1.2 %
HCT: 49.1 % (ref 38.5–50.0)
Hemoglobin: 16.1 g/dL (ref 13.2–17.1)
Lymphs Abs: 1082 cells/uL (ref 850–3900)
MCH: 30.3 pg (ref 27.0–33.0)
MCHC: 32.8 g/dL (ref 32.0–36.0)
MCV: 92.5 fL (ref 80.0–100.0)
MPV: 9.6 fL (ref 7.5–12.5)
Monocytes Relative: 5.4 %
Neutro Abs: 3754 cells/uL (ref 1500–7800)
Neutrophils Relative %: 72.2 %
Platelets: 153 10*3/uL (ref 140–400)
RBC: 5.31 10*6/uL (ref 4.20–5.80)
RDW: 13.6 % (ref 11.0–15.0)
Total Lymphocyte: 20.8 %
WBC: 5.2 10*3/uL (ref 3.8–10.8)

## 2021-08-05 LAB — MICROALBUMIN / CREATININE URINE RATIO
Creatinine, Urine: 41 mg/dL (ref 20–320)
Microalb, Ur: <0.2 mg/dL

## 2021-08-05 LAB — PSA: PSA: 1.12 ng/mL (ref <=4.00)

## 2021-08-05 LAB — URINALYSIS, ROUTINE W REFLEX MICROSCOPIC
Bilirubin Urine: NEGATIVE
Glucose, UA: NEGATIVE
Hgb urine dipstick: NEGATIVE
Ketones, ur: NEGATIVE
Leukocytes,Ua: NEGATIVE
Nitrite: NEGATIVE
Protein, ur: NEGATIVE
Specific Gravity, Urine: 1.007 (ref 1.001–1.035)
pH: 6.5 (ref 5.0–8.0)

## 2021-08-05 LAB — HEMOGLOBIN A1C
Hgb A1c MFr Bld: 5.4 % of total Hgb (ref <5.7)
Mean Plasma Glucose: 108 mg/dL
eAG (mmol/L): 6 mmol/L

## 2021-08-05 LAB — COMPLETE METABOLIC PANEL WITH GFR
AG Ratio: 1.8 (calc) (ref 1.0–2.5)
ALT: 23 U/L (ref 9–46)
AST: 21 U/L (ref 10–35)
Albumin: 4.4 g/dL (ref 3.6–5.1)
Alkaline phosphatase (APISO): 69 U/L (ref 35–144)
BUN: 18 mg/dL (ref 7–25)
CO2: 24 mmol/L (ref 20–32)
Calcium: 9.3 mg/dL (ref 8.6–10.3)
Chloride: 107 mmol/L (ref 98–110)
Creat: 1.15 mg/dL (ref 0.70–1.35)
Globulin: 2.5 g/dL (calc) (ref 1.9–3.7)
Glucose, Bld: 89 mg/dL (ref 65–99)
Potassium: 4.1 mmol/L (ref 3.5–5.3)
Sodium: 141 mmol/L (ref 135–146)
Total Bilirubin: 0.7 mg/dL (ref 0.2–1.2)
Total Protein: 6.9 g/dL (ref 6.1–8.1)
eGFR: 72 mL/min/{1.73_m2} (ref >=60)

## 2021-08-05 LAB — TSH: TSH: 2.2 mIU/L (ref 0.40–4.50)

## 2021-08-05 LAB — LIPID PANEL
Cholesterol: 147 mg/dL (ref <200)
HDL: 51 mg/dL (ref >=40)
LDL Cholesterol (Calc): 74 mg/dL (calc)
Non-HDL Cholesterol (Calc): 96 mg/dL (calc) (ref <130)
Total CHOL/HDL Ratio: 2.9 (calc) (ref <5.0)
Triglycerides: 136 mg/dL (ref <150)

## 2021-08-05 LAB — VITAMIN D 25 HYDROXY (VIT D DEFICIENCY, FRACTURES): Vit D, 25-Hydroxy: 70 ng/mL (ref 30–100)

## 2021-08-05 LAB — MAGNESIUM: Magnesium: 2.1 mg/dL (ref 1.5–2.5)

## 2021-10-03 ENCOUNTER — Other Ambulatory Visit: Payer: Self-pay

## 2021-10-03 ENCOUNTER — Ambulatory Visit (AMBULATORY_SURGERY_CENTER): Payer: 59 | Admitting: *Deleted

## 2021-10-03 VITALS — Ht 68.0 in | Wt 226.0 lb

## 2021-10-03 DIAGNOSIS — Z86010 Personal history of colonic polyps: Secondary | ICD-10-CM

## 2021-10-03 MED ORDER — PEG-KCL-NACL-NASULF-NA ASC-C 100 G PO SOLR: 1.0000 | Freq: Once | ORAL | 0 refills | Status: AC

## 2021-10-03 NOTE — Progress Notes (Signed)

## 2021-10-15 ENCOUNTER — Other Ambulatory Visit: Payer: Self-pay | Admitting: Internal Medicine

## 2021-10-19 ENCOUNTER — Encounter: Payer: Self-pay | Admitting: Certified Registered Nurse Anesthetist

## 2021-10-20 ENCOUNTER — Ambulatory Visit (AMBULATORY_SURGERY_CENTER): Payer: 59 | Admitting: Gastroenterology

## 2021-10-20 ENCOUNTER — Encounter: Payer: Self-pay | Admitting: Gastroenterology

## 2021-10-20 VITALS — BP 127/86 | HR 68 | Temp 98.4°F | Resp 10 | Ht 68.0 in | Wt 226.0 lb

## 2021-10-20 DIAGNOSIS — Z86010 Personal history of colonic polyps: Secondary | ICD-10-CM | POA: Diagnosis present

## 2021-10-20 DIAGNOSIS — D125 Benign neoplasm of sigmoid colon: Secondary | ICD-10-CM

## 2021-10-20 DIAGNOSIS — K635 Polyp of colon: Secondary | ICD-10-CM

## 2021-10-20 MED ORDER — SODIUM CHLORIDE 0.9 % IV SOLN: 500.0000 mL | Freq: Once | INTRAVENOUS | Status: DC

## 2021-10-20 NOTE — Progress Notes (Signed)
History & Physical  Primary Care Physician:  Unk Pinto, MD Primary Gastroenterologist: Lucio Edward, MD  CHIEF COMPLAINT:  Personal history of colon polyps   HPI: Barry Miles. is a 65 y.o. male with a history of adenomatous colon polyps presenting for colonoscopy.  Last colonoscopy performed in 2019.   Past Medical History:  Diagnosis Date   Arthritis    Arthritis    Carpal tunnel syndrome on both sides    Erectile dysfunction    GERD (gastroesophageal reflux disease)    Seizure ([place]) 08/2019   Vertigo     Past Surgical History:  Procedure Laterality Date   COLONOSCOPY  10/03/2018   Dr.Adlene Adduci   POLYPECTOMY     TONSILLECTOMY     WISDOM TOOTH EXTRACTION      Prior to Admission medications   Medication Sig Start Date End Date Taking? Authorizing Provider  Cholecalciferol (VITAMIN D3) 2000 UNITS TABS Take 3 capsules by mouth daily.    Yes [provider]  ibuprofen (ADVIL) 200 MG tablet Take 200 mg by mouth every 6 (six) hours as needed.   Yes [provider]  Magnesium 250 MG TABS Take 0.8 tablets (200 mg total) by mouth daily. 10/24/19  Yes McClanahan, Danton Sewer, NP  Multiple Vitamins-Minerals (MULTIVITAMIN WITH MINERALS) tablet Take 1 tablet by mouth daily.   Yes [provider]  RABEprazole (ACIPHEX) 20 MG tablet TAKE 1 TABLET DAILY TO PREVENT INDIGESTION & HEARTBURN 10/15/21  Yes Magda Bernheim, NP  rosuvastatin (CRESTOR) 10 MG tablet TAKE 1 TABLET BY MOUTH EVERY DAY  Is taking one every other day and not on the weekends 08/04/21. 08/05/21  Yes O'Neal, Cassie Freer, MD  vitamin C (ASCORBIC ACID) 500 MG tablet Take 500 mg by mouth daily.   Yes [provider]  meclizine (ANTIVERT) 25 MG tablet 1/2-1 pill up to 3 times daily for motion sickness/dizziness 09/16/20   Garnet Sierras, NP  tadalafil (CIALIS) 20 MG tablet Take 1 tablet (20 mg total) by mouth daily as needed for erectile dysfunction. 08/04/21   Magda Bernheim, NP     Current Outpatient Medications  Medication Sig Dispense Refill   Cholecalciferol (VITAMIN D3) 2000 UNITS TABS Take 3 capsules by mouth daily.      ibuprofen (ADVIL) 200 MG tablet Take 200 mg by mouth every 6 (six) hours as needed.     Magnesium 250 MG TABS Take 0.8 tablets (200 mg total) by mouth daily. 30 tablet 1   Multiple Vitamins-Minerals (MULTIVITAMIN WITH MINERALS) tablet Take 1 tablet by mouth daily.     RABEprazole (ACIPHEX) 20 MG tablet TAKE 1 TABLET DAILY TO PREVENT INDIGESTION & HEARTBURN 90 tablet 1   rosuvastatin (CRESTOR) 10 MG tablet TAKE 1 TABLET BY MOUTH EVERY DAY  Is taking one every other day and not on the weekends 08/04/21. 30 tablet 1   vitamin C (ASCORBIC ACID) 500 MG tablet Take 500 mg by mouth daily.     meclizine (ANTIVERT) 25 MG tablet 1/2-1 pill up to 3 times daily for motion sickness/dizziness 30 tablet 0   tadalafil (CIALIS) 20 MG tablet Take 1 tablet (20 mg total) by mouth daily as needed for erectile dysfunction. 10 tablet 2   Current Facility-Administered Medications  Medication Dose Route Frequency Provider Last Rate Last Admin   0.9 %  sodium chloride infusion  500 mL Intravenous Once Ladene Artist, MD        Allergies as of 10/20/2021 - Review Complete 10/20/2021  Allergen  Reaction Noted   Terbinafine and related Rash 09/16/2020    Family History  Problem Relation Age of Onset   Diabetes Mother    Breast cancer Mother    Diabetes Father    Heart disease Father    Colon polyps Maternal Aunt    Colon polyps Maternal Uncle    Diabetes Maternal Grandmother    Diabetes Paternal Grandmother    Colon cancer Neg Hx    Esophageal cancer Neg Hx    Stomach cancer Neg Hx    Rectal cancer Neg Hx    Pancreatic cancer Neg Hx    Seizures Neg Hx     Social History   Socioeconomic History   Marital status: Married    Spouse name: Not on file   Number of children: 3   Years of education: Not on file   Highest education level: Not on file   Occupational History   Not on file  Tobacco Use   Smoking status: Never   Smokeless tobacco: Never  Vaping Use   Vaping Use: Never used  Substance and Sexual Activity   Alcohol use: No   Drug use: No   Sexual activity: Not on file  Other Topics Concern   Not on file  Social History Narrative   Lives at home with wife   Right handed   Caffeine: 2-3 cups a week   Social Determinants of Health   Financial Resource Strain: Not on file  Food Insecurity: Not on file  Transportation Needs: Not on file  Physical Activity: Not on file  Stress: Not on file  Social Connections: Not on file  Intimate Partner Violence: Not on file    Review of Systems:  All systems reviewed an negative except where noted in HPI.  Gen: Denies any fever, chills, sweats, anorexia, fatigue, weakness, malaise, weight loss, and sleep disorder CV: Denies chest pain, angina, palpitations, syncope, orthopnea, PND, peripheral edema, and claudication. Resp: Denies dyspnea at rest, dyspnea with exercise, cough, sputum, wheezing, coughing up blood, and pleurisy. GI: Denies vomiting blood, jaundice, and fecal incontinence.   Denies dysphagia or odynophagia. GU : Denies urinary burning, blood in urine, urinary frequency, urinary hesitancy, nocturnal urination, and urinary incontinence. MS: Denies joint pain, limitation of movement, and swelling, stiffness, low back pain, extremity pain. Denies muscle weakness, cramps, atrophy.  Derm: Denies rash, itching, dry skin, hives, moles, warts, or unhealing ulcers.  Psych: Denies depression, anxiety, memory loss, suicidal ideation, hallucinations, paranoia, and confusion. Heme: Denies bruising, bleeding, and enlarged lymph nodes. Neuro:  Denies any headaches, dizziness, paresthesias. Endo:  Denies any problems with DM, thyroid, adrenal function.   Physical Exam: General:  Alert, well-developed, in NAD Head:  Normocephalic and atraumatic. Eyes:  Sclera clear, no  icterus.   Conjunctiva pink. Ears:  Normal auditory acuity. Mouth:  No deformity or lesions.  Neck:  Supple; no masses . Lungs:  Clear throughout to auscultation.   No wheezes, crackles, or rhonchi. No acute distress. Heart:  Regular rate and rhythm; no murmurs. Abdomen:  Soft, nondistended, nontender. No masses, hepatomegaly. No obvious masses.  Normal bowel .    Rectal:  Deferred   Msk:  Symmetrical without gross deformities.. Pulses:  Normal pulses noted. Extremities:  Without edema. Neurologic:  Alert and  oriented x4;  grossly normal neurologically. Skin:  Intact without significant lesions or rashes. Cervical Nodes:  No significant cervical adenopathy. Psych:  Alert and cooperative. Normal mood and affect.   Impression / Plan:  Personal history  of adenomatous colon polyps for surveillance colonoscopy.   Pricilla Riffle. Fuller Plan  10/20/2021, 11:07 AM See Shea Evans, [facility], to contact our on call provider

## 2021-10-20 NOTE — Progress Notes (Signed)
Called to room to assist during endoscopic procedure.  Patient ID and intended procedure confirmed with present staff. Received instructions for my participation in the procedure from the performing physician.  

## 2021-10-20 NOTE — Op Note (Signed)
[facility] Patient Name: Barry Miles Procedure Date: 10/20/2021 11:02 AM MRN: [id number] Endoscopist: Ladene Artist , MD Age: 65 Referring MD:  Date of Birth: [birth year] Gender: Male Account #: [id number] Procedure:                Colonoscopy Indications:              Surveillance: Personal history of adenomatous                            polyps on last colonoscopy 3 years ago Medicines:                Monitored Anesthesia Care Procedure:                Pre-Anesthesia Assessment:                           - Prior to the procedure, a History and Physical                            was performed, and patient medications and                            allergies were reviewed. The patient's tolerance of                            previous anesthesia was also reviewed. The risks                            and benefits of the procedure and the sedation                            options and risks were discussed with the patient.                            All questions were answered, and informed consent                            was obtained. Prior Anticoagulants: The patient has                            taken no previous anticoagulant or antiplatelet                            agents. ASA Grade Assessment: III - A patient with                            severe systemic disease. After reviewing the risks                            and benefits, the patient was deemed in                            satisfactory condition to undergo the procedure.  After obtaining informed consent, the colonoscope                            was passed under direct vision. Throughout the                            procedure, the patient's blood pressure, pulse, and                            oxygen saturations were monitored continuously. The                            CF [id number] #[id number] was introduced through the anus                            and advanced to  the the cecum, identified by                            appendiceal orifice and ileocecal valve. The                            ileocecal valve, appendiceal orifice, and rectum                            were photographed. The quality of the bowel                            preparation was excellent. The colonoscopy was                            performed without difficulty. The patient tolerated                            the procedure well. Scope In: 11:09:51 AM Scope Out: 11:23:04 AM Scope Withdrawal Time: 0 hours 10 minutes 1 second  Total Procedure Duration: 0 hours 13 minutes 13 seconds  Findings:                 The perianal and digital rectal examinations were                            normal.                           A 7 mm polyp was found in the sigmoid colon. The                            polyp was sessile. The polyp was removed with a                            cold snare. Resection and retrieval were complete.                           A few small-mouthed diverticula were found in the  left colon. There was no evidence of diverticular                            bleeding.                           Internal hemorrhoids were found during                            retroflexion. The hemorrhoids were small and Grade                            I (internal hemorrhoids that do not prolapse).                           The exam was otherwise without abnormality on                            direct and retroflexion views. Complications:            No immediate complications. Estimated blood loss:                            None. Estimated Blood Loss:     Estimated blood loss: none. Impression:               - One 7 mm polyp in the sigmoid colon, removed with                            a cold snare. Resected and retrieved.                           - Mild diverticulosis in the left colon.                           - Internal hemorrhoids.                            - The examination was otherwise normal on direct                            and retroflexion views. Recommendation:           - Repeat colonoscopy after studies are complete for                            surveillance based on pathology results.                           - Patient has a contact number available for                            emergencies. The signs and symptoms of potential                            delayed complications were discussed with the  patient. Return to normal activities tomorrow.                            Written discharge instructions were provided to the                            patient.                           - Resume previous diet.                           - Continue present medications.                           - Await pathology results. Ladene Artist, MD 10/20/2021 11:25:52 AM This report has been signed electronically.

## 2021-10-20 NOTE — Patient Instructions (Signed)
Information on polyps, diverticulosis and hemorrhoids given to you today.  Await pathology results.  Resume previous diet and medications.  YOU HAD AN ENDOSCOPIC PROCEDURE TODAY AT THE [facility]:   Refer to the procedure report that was given to you for any specific questions about what was found during the examination.  If the procedure report does not answer your questions, please call your gastroenterologist to clarify.  If you requested that your care partner not be given the details of your procedure findings, then the procedure report has been included in a sealed envelope for you to review at your convenience later.  YOU SHOULD EXPECT: Some feelings of bloating in the abdomen. Passage of more gas than usual.  Walking can help get rid of the air that was put into your GI tract during the procedure and reduce the bloating. If you had a lower endoscopy (such as a colonoscopy or flexible sigmoidoscopy) you may notice spotting of blood in your stool or on the toilet paper. If you underwent a bowel prep for your procedure, you may not have a normal bowel movement for a few days.  Please Note:  You might notice some irritation and congestion in your nose or some drainage.  This is from the oxygen used during your procedure.  There is no need for concern and it should clear up in a day or so.  SYMPTOMS TO REPORT IMMEDIATELY:   Following lower endoscopy (colonoscopy or flexible sigmoidoscopy):  Excessive amounts of blood in the stool  Significant tenderness or worsening of abdominal pains  Swelling of the abdomen that is new, acute  Fever of 100F or higher   For urgent or emergent issues, a gastroenterologist can be reached at any hour by calling [phone]. Do not use MyChart messaging for urgent concerns.    DIET:  We do recommend a small meal at first, but then you may proceed to your regular diet.  Drink plenty of fluids but you should avoid alcoholic beverages for 24  hours.  ACTIVITY:  You should plan to take it easy for the rest of today and you should NOT DRIVE or use heavy machinery until tomorrow (because of the sedation medicines used during the test).    FOLLOW UP: Our staff will call the number listed on your records 48-72 hours following your procedure to check on you and address any questions or concerns that you may have regarding the information given to you following your procedure. If we do not reach you, we will leave a message.  We will attempt to reach you two times.  During this call, we will ask if you have developed any symptoms of COVID 19. If you develop any symptoms (ie: fever, flu-like symptoms, shortness of breath, cough etc.) before then, please call [phone].  If you test positive for Covid 19 in the 2 weeks post procedure, please call and report this information to us.    If any biopsies were taken you will be contacted by phone or by letter within the next 1-3 weeks.  Please call us at [phone] if you have not heard about the biopsies in 3 weeks.    SIGNATURES/CONFIDENTIALITY: You and/or your care partner have signed paperwork which will be entered into your electronic medical record.  These signatures attest to the fact that that the information above on your After Visit Summary has been reviewed and is understood.  Full responsibility of the confidentiality of this discharge information lies with you   and/or your care-partner. 

## 2021-10-20 NOTE — Progress Notes (Signed)
Pt's states no medical or surgical changes since previsit or office visit. 

## 2021-10-20 NOTE — Progress Notes (Signed)
Report given to PACU, vss 

## 2021-10-22 ENCOUNTER — Telehealth: Payer: Self-pay

## 2021-10-22 NOTE — Telephone Encounter (Signed)
  Follow up Call-  Call back number 10/20/2021  Post procedure Call Back phone  # [phone] (wife)  Permission to leave phone message Yes  Some recent data might be hidden     Patient questions:  Do you have a fever, pain , or abdominal swelling? No. Pain Score  0 *  Have you tolerated food without any problems? Yes.    Have you been able to return to your normal activities? Yes.    Do you have any questions about your discharge instructions: Diet   No. Medications  No. Follow up visit  No.  Do you have questions or concerns about your Care? No.  Actions: * If pain score is 4 or above: No action needed, pain <4.   Have you developed a fever since your procedure? no  2.   Have you had an respiratory symptoms (SOB or cough) since your procedure? no  3.   Have you tested positive for COVID 19 since your procedure no  4.   Have you had any family members/close contacts diagnosed with the COVID 19 since your procedure?  no   If yes to any of these questions please route to Joylene John, RN and Joella Prince, RN

## 2021-11-04 ENCOUNTER — Encounter: Payer: Self-pay | Admitting: Gastroenterology

## 2022-02-18 NOTE — Progress Notes (Signed)
? ?FOLLOW UP ? ?Assessment and Plan:  ? ?Hypertension ?Currently not controlled- add HCTZ 25  mg daily ?Monitor blood pressure at home; patient to call if consistently greater than 130/80 ?Continue DASH diet.   ?Reminder to go to the ER if any CP, SOB, nausea, dizziness, severe HA, changes vision/speech, left arm numbness and tingling and jaw pain. ?- CBC ? ?Cholesterol ?Currently at goal;  ?Continue low cholesterol diet and exercise.  ?Check lipid panel and TSH ? ?Abnormal glucose ?Continue medication: ?Continue diet and exercise.  ?Perform daily foot/skin check, notify office of any concerning changes.  ?Check CMP ? ?GERD ?Currently on Aciphex , Will switch to Protonix ?Dietary modifications ?- Magnesium  ? ?Vertigo ?Meclizine '25mg'$  1/2-1 pill q8 hours as needed ?Continue to monitor ? ?Obesity with co morbidities ?Long discussion about weight loss, diet, and exercise ?Recommended diet heavy in fruits and veggies and low in animal meats, cheeses, and dairy products, appropriate calorie intake ?Will follow up in 3 months ? ?Vitamin D Def ?At goal at last visit; continue supplementation to maintain goal of 60-100 ?Defer Vit D level ? ?Medication Management ?Continued ? ? ?Continue diet and meds as discussed. Further disposition pending results of labs. Discussed med's effects and SE's.   ?Over 30 minutes of exam, counseling, chart review, and critical decision making was performed.  ? ?Future Appointments  ?Date Time Provider [place]  ?08/04/2022  9:00 AM Magda Bernheim, NP [facility] None  ? ? ?---------------------------------------------------------------------------------------------------------------------- ? ?HPI ?66 y.o. male  presents for 3 month follow up on hypertension, cholesterol, diabetes, weight and vitamin D deficiency.  ? ?He woke up this AM with dizziness. Occurred when he first woke up but has improved. He does have a history of vertigo and has used Meclizine in the past.  ? ?BMI is Body  mass index is 33.75 kg/m?., he has been working on diet and exercise. ?Wt Readings from Last 3 Encounters:  ?02/19/22 222 lb (100.7 kg)  ?10/20/21 226 lb (102.5 kg)  ?10/03/21 226 lb (102.5 kg)  ? ? ?His blood pressure has been controlled at home, today their BP is BP: (!) 138/98  He woke up this AM and furnace was not working, stressed.  ?BP Readings from Last 3 Encounters:  ?02/19/22 (!) 138/98  ?10/20/21 127/86  ?08/04/21 136/84  ?  ? He does workout. He denies chest pain, shortness of breath, dizziness. ? ? He is on cholesterol medication Rosuvastatin and denies myalgias. His cholesterol is at goal. 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 The cholesterol last visit was:   ?Lab Results  ?Component Value Date  ? CHOL 147 08/04/2021  ? HDL 51 08/04/2021  ? [place] 74 08/04/2021  ? TRIG 136 08/04/2021  ? CHOLHDL 2.9 08/04/2021  ? ? He has been working on diet and exercise for abnormal glucose. Last A1C in the office was:  ?Lab Results  ?Component Value Date  ? HGBA1C 5.4 08/04/2021  ? ?Patient is on Vitamin D supplement.   ?Lab Results  ?Component Value Date  ? VD25OH 70 08/04/2021  ?   ? ? ?Past Medical History:  ?Diagnosis Date  ? Arthritis   ? Arthritis   ? Carpal tunnel syndrome on both sides   ? Erectile dysfunction   ? GERD (gastroesophageal reflux disease)   ? Seizure ([place]) 08/2019  ? Vertigo   ?  ? ?Allergies  ?Allergen Reactions  ? Terbinafine And Related Rash  ? ? ?Current  Outpatient Medications on File Prior to Visit  ?Medication Sig  ? Cholecalciferol (VITAMIN D3) 2000 UNITS TABS Take 3 capsules by mouth daily.   ? ibuprofen (ADVIL) 200 MG tablet Take 200 mg by mouth every 6 (six) hours as needed.  ? Magnesium 250 MG TABS Take 0.8 tablets (200 mg total) by mouth daily.  ? meclizine (ANTIVERT) 25 MG tablet 1/2-1 pill up to 3 times daily for motion sickness/dizziness  ? Multiple Vitamins-Minerals (MULTIVITAMIN WITH MINERALS) tablet Take 1 tablet by mouth daily.  ? RABEprazole (ACIPHEX) 20  MG tablet TAKE 1 TABLET DAILY TO PREVENT INDIGESTION & HEARTBURN  ? rosuvastatin (CRESTOR) 10 MG tablet TAKE 1 TABLET BY MOUTH EVERY DAY ? ?Is taking one every other day and not on the weekends 08/04/21.  ? tadalafil (CIALIS) 20 MG tablet Take 1 tablet (20 mg total) by mouth daily as needed for erectile dysfunction.  ? vitamin C (ASCORBIC ACID) 500 MG tablet Take 500 mg by mouth daily.  ? ?No current facility-administered medications on file prior to visit.  ? ? ?Review of Systems  ?Constitutional:  Negative for chills, fever and weight loss.  ?HENT:  Negative for congestion and hearing loss.   ?Eyes:  Negative for blurred vision and double vision.  ?Respiratory:  Negative for cough and shortness of breath.   ?Cardiovascular:  Negative for chest pain, palpitations, orthopnea and leg swelling.  ?Gastrointestinal:  Negative for abdominal pain, constipation, diarrhea, heartburn, nausea and vomiting.  ?Musculoskeletal:  Negative for falls, joint pain and myalgias.  ?Skin:  Negative for rash.  ?Neurological:  Positive for dizziness. Negative for tingling, tremors, loss of consciousness and headaches.  ?Psychiatric/Behavioral:  Negative for depression, memory loss and suicidal ideas.   ? ? ?Physical Exam: ? ?BP (!) 138/98   Pulse 85   Temp (!) 97.5 ?F (36.4 ?C)   Wt 222 lb (100.7 kg)   SpO2 98%   BMI 33.75 kg/m?  ? ?General Appearance: Well nourished, in no apparent distress. ?Eyes: PERRLA, EOMs, conjunctiva no swelling or erythema ?Sinuses: No Frontal/maxillary tenderness ?ENT/Mouth: Ext aud canals clear, TMs without erythema, bulging. No erythema, swelling, or exudate on post pharynx.  Tonsils not swollen or erythematous. Hearing normal.  ?Neck: Supple, thyroid normal.  ?Respiratory: Respiratory effort normal, BS equal bilaterally without rales, rhonchi, wheezing or stridor.  ?Cardio: RRR with no MRGs. Brisk peripheral pulses without edema.  ?Abdomen: Soft, + BS.  Non tender, no guarding, rebound, hernias,  masses. ?Lymphatics: Non tender without lymphadenopathy.  ?Musculoskeletal: Full ROM, 5/5 strength, normal gait.  ?Skin: Warm, dry without rashes, lesions, ecchymosis.  ?Neuro: Cranial nerves intact. Normal muscle tone, no cerebellar symptoms. Sensation intact.  ?Psych: Awake and oriented X 3, normal affect, Insight and Judgment appropriate.  ? ? ? ? ?Eldonna Neuenfeldt Kathyrn Drown, NP ?5:54 PM ?[facility] ? ?

## 2022-02-19 ENCOUNTER — Other Ambulatory Visit: Payer: Self-pay

## 2022-02-19 ENCOUNTER — Ambulatory Visit (INDEPENDENT_AMBULATORY_CARE_PROVIDER_SITE_OTHER): Payer: 59 | Admitting: Nurse Practitioner

## 2022-02-19 ENCOUNTER — Encounter: Payer: Self-pay | Admitting: Nurse Practitioner

## 2022-02-19 VITALS — BP 138/98 | HR 85 | Temp 97.5°F | Wt 222.0 lb

## 2022-02-19 DIAGNOSIS — R7309 Other abnormal glucose: Secondary | ICD-10-CM

## 2022-02-19 DIAGNOSIS — H8113 Benign paroxysmal vertigo, bilateral: Secondary | ICD-10-CM | POA: Diagnosis not present

## 2022-02-19 DIAGNOSIS — K2100 Gastro-esophageal reflux disease with esophagitis, without bleeding: Secondary | ICD-10-CM | POA: Diagnosis not present

## 2022-02-19 DIAGNOSIS — I10 Essential (primary) hypertension: Secondary | ICD-10-CM

## 2022-02-19 DIAGNOSIS — B351 Tinea unguium: Secondary | ICD-10-CM

## 2022-02-19 DIAGNOSIS — E559 Vitamin D deficiency, unspecified: Secondary | ICD-10-CM

## 2022-02-19 DIAGNOSIS — E782 Mixed hyperlipidemia: Secondary | ICD-10-CM

## 2022-02-19 DIAGNOSIS — Z79899 Other long term (current) drug therapy: Secondary | ICD-10-CM | POA: Diagnosis not present

## 2022-02-19 DIAGNOSIS — Z6834 Body mass index (BMI) 34.0-34.9, adult: Secondary | ICD-10-CM

## 2022-02-19 MED ORDER — HYDROCHLOROTHIAZIDE 25 MG PO TABS: 25.0000 mg | ORAL_TABLET | Freq: Every day | ORAL | 3 refills | Status: DC

## 2022-02-19 MED ORDER — MECLIZINE HCL 25 MG PO TABS: ORAL_TABLET | ORAL | 0 refills | Status: AC

## 2022-02-19 MED ORDER — PANTOPRAZOLE SODIUM 40 MG PO TBEC: 40.0000 mg | DELAYED_RELEASE_TABLET | Freq: Every day | ORAL | 1 refills | Status: DC

## 2022-02-19 NOTE — Patient Instructions (Addendum)
Log blood pressure once a day for 2 weeks ?If BP is not consistently below 130/80 will have a sooner return visit ?Go to the ER if any chest pain, shortness of breath, nausea, dizziness, severe HA, changes vision/speech  ? ?Hydrochlorothiazide Capsules or Tablets ?What is this medication? ?HYDROCHLOROTHIAZIDE (hye droe klor oh THYE a zide) treats high blood pressure. It may also be used to reduce swelling related to heart, kidney, or liver disease. It helps your kidneys remove more fluid and salt from your blood through the urine. It belongs to a group of medications called diuretics. ?This medicine may be used for other purposes; ask your health care provider or pharmacist if you have questions. ?COMMON BRAND NAME(S): Esidrix, Ezide, HydroDIURIL, Microzide, Oretic, Zide ?What should I tell my care team before I take this medication? ?They need to know if you have any of these conditions: ?Diabetes ?Gout ?Kidney disease ?Liver disease ?Lupus ?Pancreatitis ?An unusual or allergic reaction to hydrochlorothiazide, sulfa drugs, other medications, foods, dyes, or preservatives ?Pregnant or trying to get pregnant ?Breast-feeding ?How should I use this medication? ?Take this medication by mouth. Take it as directed on the prescription label at the same time every day. You can take it with or without food. If it upsets your stomach, take it with food. Keep taking it unless your care team tells you to stop. ?Talk to your care team about the use of this medication in children. While it may be prescribed for children as young as newborns for selected conditions, precautions do apply. ?Overdosage: If you think you have taken too much of this medicine contact a poison control center or emergency room at once. ?NOTE: This medicine is only for you. Do not share this medicine with others. ?What if I miss a dose? ?If you miss a dose, take it as soon as you can. If it is almost time for your next dose, take only that dose. Do not  take double or extra doses. ?What may interact with this medication? ?Cholestyramine ?Colestipol ?Digoxin ?Dofetilide ?Lithium ?Medications for blood pressure ?Medications for diabetes ?Medications that relax muscles for surgery ?Other diuretics ?Steroid medications like prednisone or cortisone ?This list may not describe all possible interactions. Give your health care provider a list of all the medicines, herbs, non-prescription drugs, or dietary supplements you use. Also tell them if you smoke, drink alcohol, or use illegal drugs. Some items may interact with your medicine. ?What should I watch for while using this medication? ?Visit your health care provider for regular check-ups. Check your blood pressure as directed. Ask your health care provider what your blood pressure should be. Also, find out when you should contact him or her. ?Do not treat yourself for coughs, colds, or pain while you are using this medication without asking your health care provider for advice. Some medications may increase your blood pressure. ?You may get drowsy or dizzy. Do not drive, use machinery, or do anything that needs mental alertness until you know how this medication affects you. Do not stand or sit up quickly, especially if you are an older patient. This reduces the risk of dizzy or fainting spells. Alcohol can make you more drowsy and dizzy. Avoid alcoholic drinks. ?Talk to your health care professional about your risk of skin cancer. You may be more at risk for skin cancer if you take this medication. ?This medication can make you more sensitive to the sun. Keep out of the sun. If you cannot avoid being in the sun,  wear protective clothing and use sunscreen. Do not use sun lamps or tanning beds/booths. ?You may need to be on a special diet while taking this medication. Ask your health care provider. Also, find out how many glasses of fluids you need to drink each day. ?Check with your health care provider if you get an  attack of severe diarrhea, nausea and vomiting, or if you sweat a lot. The loss of too much body fluid can make it dangerous for you to take this medication. ?This medication may increase blood sugar. Ask your healthcare provider if changes in diet or medications are needed if you have diabetes. ?What side effects may I notice from receiving this medication? ?Side effects that you should report to your care team as soon as possible: ?Allergic reactions--skin rash, itching, hives, swelling of the face, lips, tongue, or throat ?Dehydration--increased thirst, dry mouth, feeling faint or lightheaded, headache, dark yellow or brown urine ?Gout--severe pain, redness, warmth, or swelling in joints, such as the big toe ?Kidney injury--decrease in the amount of urine, swelling of the ankles, hands, or feet ?Low blood pressure--dizziness, feeling faint or lightheaded, blurry vision ?Low potassium level--muscle pain or cramps, unusual weakness, fatigue, fast or irregular heartbeat, constipation ?Sudden eye pain or change in vision such as blurred vision, seeing halos around lights, vision loss ?Side effects that usually do not require medical attention (report to your care team if they continue or are bothersome): ?Change in sex drive or performance ?Headache ?Upset stomach ?This list may not describe all possible side effects. Call your doctor for medical advice about side effects. You may report side effects to FDA at 1-800-FDA-1088. ?Where should I keep my medication? ?Keep out of the reach of children and pets. ?Store at room temperature between 20 and 25 degrees C (68 and 77 degrees F). Protect from light and moisture. Keep the container tightly closed. Do not freeze. Get rid of any unused medication after the expiration date. ?To get rid of medications that are no longer needed or have expired: ?Take the medication to a medication take-back program. Check with your pharmacy or law enforcement to find a location. ?If you  cannot return the medication, check the label or package insert to see if the medication should be thrown out in the garbage or flushed down the toilet. If you are not sure, ask your care team. If it is safe to put in the trash, empty the medication out of the container. Mix the medication with cat litter, dirt, coffee grounds, or other unwanted substance. Seal the mixture in a bag or container. Put it in the trash. ?NOTE: This sheet is a summary. It may not cover all possible information. If you have questions about this medicine, talk to your doctor, pharmacist, or health care provider. ?? 2022 Elsevier/Gold Standard (2021-08-12 00:00:00) ? ?

## 2022-02-20 LAB — CBC WITH DIFFERENTIAL/PLATELET
Absolute Monocytes: 336 cells/uL (ref 200–950)
Basophils Absolute: 29 cells/uL (ref 0–200)
Basophils Relative: 0.5 %
Eosinophils Absolute: 57 cells/uL (ref 15–500)
Eosinophils Relative: 1 %
HCT: 44.9 % (ref 38.5–50.0)
Hemoglobin: 15.2 g/dL (ref 13.2–17.1)
Lymphs Abs: 1140 cells/uL (ref 850–3900)
MCH: 31 pg (ref 27.0–33.0)
MCHC: 33.9 g/dL (ref 32.0–36.0)
MCV: 91.6 fL (ref 80.0–100.0)
MPV: 10 fL (ref 7.5–12.5)
Monocytes Relative: 5.9 %
Neutro Abs: 4138 cells/uL (ref 1500–7800)
Neutrophils Relative %: 72.6 %
Platelets: 167 10*3/uL (ref 140–400)
RBC: 4.9 10*6/uL (ref 4.20–5.80)
RDW: 12.9 % (ref 11.0–15.0)
Total Lymphocyte: 20 %
WBC: 5.7 10*3/uL (ref 3.8–10.8)

## 2022-02-20 LAB — COMPLETE METABOLIC PANEL WITH GFR
AG Ratio: 1.7 (calc) (ref 1.0–2.5)
ALT: 17 U/L (ref 9–46)
AST: 19 U/L (ref 10–35)
Albumin: 4.5 g/dL (ref 3.6–5.1)
Alkaline phosphatase (APISO): 78 U/L (ref 35–144)
BUN: 15 mg/dL (ref 7–25)
CO2: 25 mmol/L (ref 20–32)
Calcium: 9.3 mg/dL (ref 8.6–10.3)
Chloride: 108 mmol/L (ref 98–110)
Creat: 1.18 mg/dL (ref 0.70–1.35)
Globulin: 2.6 g/dL (calc) (ref 1.9–3.7)
Glucose, Bld: 96 mg/dL (ref 65–99)
Potassium: 4.3 mmol/L (ref 3.5–5.3)
Sodium: 143 mmol/L (ref 135–146)
Total Bilirubin: 0.5 mg/dL (ref 0.2–1.2)
Total Protein: 7.1 g/dL (ref 6.1–8.1)
eGFR: 69 mL/min/{1.73_m2} (ref >=60)

## 2022-02-20 LAB — MAGNESIUM: Magnesium: 2.2 mg/dL (ref 1.5–2.5)

## 2022-02-20 LAB — LIPID PANEL
Cholesterol: 121 mg/dL (ref <200)
HDL: 50 mg/dL (ref >=40)
LDL Cholesterol (Calc): 52 mg/dL (calc)
Non-HDL Cholesterol (Calc): 71 mg/dL (calc) (ref <130)
Total CHOL/HDL Ratio: 2.4 (calc) (ref <5.0)
Triglycerides: 106 mg/dL (ref <150)

## 2022-02-20 LAB — TSH: TSH: 1.62 mIU/L (ref 0.40–4.50)

## 2022-03-14 ENCOUNTER — Other Ambulatory Visit: Payer: Self-pay | Admitting: Cardiovascular Disease

## 2022-03-14 ENCOUNTER — Other Ambulatory Visit: Payer: Self-pay | Admitting: Nurse Practitioner

## 2022-03-14 DIAGNOSIS — K2100 Gastro-esophageal reflux disease with esophagitis, without bleeding: Secondary | ICD-10-CM

## 2022-04-09 ENCOUNTER — Other Ambulatory Visit: Payer: Self-pay | Admitting: Cardiovascular Disease

## 2022-04-09 ENCOUNTER — Other Ambulatory Visit: Payer: Self-pay | Admitting: Nurse Practitioner

## 2022-04-09 DIAGNOSIS — K2100 Gastro-esophageal reflux disease with esophagitis, without bleeding: Secondary | ICD-10-CM

## 2022-05-03 ENCOUNTER — Other Ambulatory Visit: Payer: Self-pay | Admitting: Cardiovascular Disease

## 2022-05-07 ENCOUNTER — Other Ambulatory Visit: Payer: Self-pay | Admitting: Nurse Practitioner

## 2022-05-07 DIAGNOSIS — K2100 Gastro-esophageal reflux disease with esophagitis, without bleeding: Secondary | ICD-10-CM

## 2022-05-29 ENCOUNTER — Other Ambulatory Visit: Payer: Self-pay | Admitting: Cardiovascular Disease

## 2022-06-04 ENCOUNTER — Ambulatory Visit: Payer: 59 | Admitting: Nurse Practitioner

## 2022-06-07 ENCOUNTER — Other Ambulatory Visit: Payer: Self-pay | Admitting: Nurse Practitioner

## 2022-06-07 DIAGNOSIS — K2100 Gastro-esophageal reflux disease with esophagitis, without bleeding: Secondary | ICD-10-CM

## 2022-06-11 ENCOUNTER — Other Ambulatory Visit: Payer: Self-pay | Admitting: Nurse Practitioner

## 2022-06-11 DIAGNOSIS — I10 Essential (primary) hypertension: Secondary | ICD-10-CM

## 2022-06-15 NOTE — Progress Notes (Unsigned)
FOLLOW UP  Assessment and Plan:   Hypertension Currently not controlled- add HCTZ 25  mg daily Monitor blood pressure at home; patient to call if consistently greater than 130/80 Continue DASH diet.   Reminder to go to the ER if any CP, SOB, nausea, dizziness, severe HA, changes vision/speech, left arm numbness and tingling and jaw pain. - CBC  Cholesterol Currently at goal;  Continue low cholesterol diet and exercise.  Check lipid panel   Abnormal glucose Continue medication: Continue diet and exercise.  Perform daily foot/skin check, notify office of any concerning changes.  A1c, CMP  GERD Currently on Aciphex , Will switch to Protonix Dietary modifications - Magnesium   Vertigo Meclizine '25mg'$  1/2-1 pill q8 hours as needed Continue to monitor  Obesity with co morbidities Long discussion about weight loss, diet, and exercise Recommended diet heavy in fruits and veggies and low in animal meats, cheeses, and dairy products, appropriate calorie intake Will follow up in 3 months  Vitamin D Def At goal at last visit; continue supplementation to maintain goal of 60-100 Defer Vit D level  Medication Management Continued   Continue diet and meds as discussed. Further disposition pending results of labs. Discussed med's effects and SE's.   Over 30 minutes of exam, counseling, chart review, and critical decision making was performed.   Future Appointments  Date Time Provider [place]  06/16/2022  4:00 PM Alycia Rossetti, NP [facility] None  08/19/2022 10:00 AM Alycia Rossetti, NP [facility] None    ----------------------------------------------------------------------------------------------------------------------  HPI 66 y.o. male  presents for 3 month follow up on hypertension, cholesterol, diabetes, weight and vitamin D deficiency.   He woke up this AM with dizziness. Occurred when he first woke up but has improved. He does have a history of vertigo  and has used Meclizine in the past.   BMI is There is no height or weight on file to calculate BMI., he has been working on diet and exercise. Wt Readings from Last 3 Encounters:  02/19/22 222 lb (100.7 kg)  10/20/21 226 lb (102.5 kg)  10/03/21 226 lb (102.5 kg)    His blood pressure has been controlled at home, today their BP is    He woke up this AM and furnace was not working, stressed.  BP Readings from Last 3 Encounters:  02/19/22 (!) 138/98  10/20/21 127/86  08/04/21 136/84     He does workout. He denies chest pain, shortness of breath, dizziness.   He is on cholesterol medication Rosuvastatin and denies myalgias. His cholesterol is at goal. 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 The cholesterol last visit was:   Lab Results  Component Value Date   CHOL 121 02/19/2022   HDL 50 02/19/2022   LDLCALC 52 02/19/2022   TRIG 106 02/19/2022   CHOLHDL 2.4 02/19/2022    He has been working on diet and exercise for abnormal glucose. Last A1C in the office was:  Lab Results  Component Value Date   HGBA1C 5.4 08/04/2021   Patient is on Vitamin D supplement.   Lab Results  Component Value Date   VD25OH 70 08/04/2021       Past Medical History:  Diagnosis Date   Arthritis    Arthritis    Carpal tunnel syndrome on both sides    Erectile dysfunction    GERD (gastroesophageal reflux disease)    Seizure ([place]) 08/2019   Vertigo      Allergies  Allergen Reactions   Terbinafine And Related Rash    Current Outpatient Medications on File Prior to Visit  Medication Sig   Cholecalciferol (VITAMIN D3) 2000 UNITS TABS Take 3 capsules by mouth daily.    hydrochlorothiazide (HYDRODIURIL) 25 MG tablet TAKE 1 TABLET (25 MG TOTAL) BY MOUTH DAILY.   ibuprofen (ADVIL) 200 MG tablet Take 200 mg by mouth every 6 (six) hours as needed.   Magnesium 250 MG TABS Take 0.8 tablets (200 mg total) by mouth daily.   meclizine (ANTIVERT) 25 MG tablet 1/2-1 pill up to 3 times  daily for motion sickness/dizziness   Multiple Vitamins-Minerals (MULTIVITAMIN WITH MINERALS) tablet Take 1 tablet by mouth daily.   pantoprazole (PROTONIX) 40 MG tablet Take  1 tablet  Daily  to Prevent Indigestion & Heartburn                                           /                                Take                     by                        mouth   rosuvastatin (CRESTOR) 10 MG tablet TAKE 1 TABLET BY MOUTH EVERY DAY. Schedule an appointment for further refills, 3rd attempt   tadalafil (CIALIS) 20 MG tablet Take 1 tablet (20 mg total) by mouth daily as needed for erectile dysfunction.   vitamin C (ASCORBIC ACID) 500 MG tablet Take 500 mg by mouth daily.   No current facility-administered medications on file prior to visit.    Review of Systems  Constitutional:  Negative for chills, fever and weight loss.  HENT:  Negative for congestion and hearing loss.   Eyes:  Negative for blurred vision and double vision.  Respiratory:  Negative for cough and shortness of breath.   Cardiovascular:  Negative for chest pain, palpitations, orthopnea and leg swelling.  Gastrointestinal:  Negative for abdominal pain, constipation, diarrhea, heartburn, nausea and vomiting.  Musculoskeletal:  Negative for falls, joint pain and myalgias.  Skin:  Negative for rash.  Neurological:  Positive for dizziness. Negative for tingling, tremors, loss of consciousness and headaches.  Psychiatric/Behavioral:  Negative for depression, memory loss and suicidal ideas.      Physical Exam:  There were no vitals taken for this visit.  General Appearance: Well nourished, in no apparent distress. Eyes: PERRLA, EOMs, conjunctiva no swelling or erythema Sinuses: No Frontal/maxillary tenderness ENT/Mouth: Ext aud canals clear, TMs without erythema, bulging. No erythema, swelling, or exudate on post pharynx.  Tonsils not swollen or erythematous. Hearing normal.  Neck: Supple, thyroid normal.  Respiratory: Respiratory  effort normal, BS equal bilaterally without rales, rhonchi, wheezing or stridor.  Cardio: RRR with no MRGs. Brisk peripheral pulses without edema.  Abdomen: Soft, + BS.  Non tender, no guarding, rebound, hernias, masses. Lymphatics: Non tender without lymphadenopathy.  Musculoskeletal: Full ROM, 5/5 strength, normal gait.  Skin: Warm, dry without rashes, lesions, ecchymosis.  Neuro: Cranial nerves intact. Normal muscle tone, no cerebellar symptoms. Sensation intact.  Psych: Awake and oriented X 3, normal affect, Insight and Judgment appropriate.      [place] Holian E  Carlye Grippe, NP 5:54 PM [facility]

## 2022-06-16 ENCOUNTER — Encounter: Payer: Self-pay | Admitting: Nurse Practitioner

## 2022-06-16 ENCOUNTER — Ambulatory Visit (INDEPENDENT_AMBULATORY_CARE_PROVIDER_SITE_OTHER): Payer: 59 | Admitting: Nurse Practitioner

## 2022-06-16 VITALS — BP 114/78 | HR 98 | Temp 97.5°F | Wt 210.8 lb

## 2022-06-16 DIAGNOSIS — H8113 Benign paroxysmal vertigo, bilateral: Secondary | ICD-10-CM

## 2022-06-16 DIAGNOSIS — I10 Essential (primary) hypertension: Secondary | ICD-10-CM

## 2022-06-16 DIAGNOSIS — R7309 Other abnormal glucose: Secondary | ICD-10-CM

## 2022-06-16 DIAGNOSIS — K2100 Gastro-esophageal reflux disease with esophagitis, without bleeding: Secondary | ICD-10-CM

## 2022-06-16 DIAGNOSIS — Z79899 Other long term (current) drug therapy: Secondary | ICD-10-CM

## 2022-06-16 DIAGNOSIS — E782 Mixed hyperlipidemia: Secondary | ICD-10-CM | POA: Diagnosis not present

## 2022-06-16 DIAGNOSIS — E559 Vitamin D deficiency, unspecified: Secondary | ICD-10-CM

## 2022-06-17 LAB — COMPLETE METABOLIC PANEL WITH GFR
AG Ratio: 2 (calc) (ref 1.0–2.5)
ALT: 18 U/L (ref 9–46)
AST: 19 U/L (ref 10–35)
Albumin: 4.5 g/dL (ref 3.6–5.1)
Alkaline phosphatase (APISO): 68 U/L (ref 35–144)
BUN/Creatinine Ratio: 14 (calc) (ref 6–22)
BUN: 20 mg/dL (ref 7–25)
CO2: 29 mmol/L (ref 20–32)
Calcium: 9.6 mg/dL (ref 8.6–10.3)
Chloride: 104 mmol/L (ref 98–110)
Creat: 1.47 mg/dL — ABNORMAL HIGH (ref 0.70–1.35)
Globulin: 2.3 g/dL (calc) (ref 1.9–3.7)
Glucose, Bld: 94 mg/dL (ref 65–99)
Potassium: 3.8 mmol/L (ref 3.5–5.3)
Sodium: 142 mmol/L (ref 135–146)
Total Bilirubin: 0.5 mg/dL (ref 0.2–1.2)
Total Protein: 6.8 g/dL (ref 6.1–8.1)
eGFR: 53 mL/min/{1.73_m2} — ABNORMAL LOW (ref >=60)

## 2022-06-17 LAB — CBC WITH DIFFERENTIAL/PLATELET
Absolute Monocytes: 421 cells/uL (ref 200–950)
Basophils Absolute: 43 cells/uL (ref 0–200)
Basophils Relative: 0.7 %
Eosinophils Absolute: 153 cells/uL (ref 15–500)
Eosinophils Relative: 2.5 %
HCT: 47.6 % (ref 38.5–50.0)
Hemoglobin: 16.1 g/dL (ref 13.2–17.1)
Lymphs Abs: 1653 cells/uL (ref 850–3900)
MCH: 30.3 pg (ref 27.0–33.0)
MCHC: 33.8 g/dL (ref 32.0–36.0)
MCV: 89.5 fL (ref 80.0–100.0)
MPV: 10 fL (ref 7.5–12.5)
Monocytes Relative: 6.9 %
Neutro Abs: 3831 cells/uL (ref 1500–7800)
Neutrophils Relative %: 62.8 %
Platelets: 169 10*3/uL (ref 140–400)
RBC: 5.32 10*6/uL (ref 4.20–5.80)
RDW: 14.1 % (ref 11.0–15.0)
Total Lymphocyte: 27.1 %
WBC: 6.1 10*3/uL (ref 3.8–10.8)

## 2022-06-17 LAB — LIPID PANEL
Cholesterol: 128 mg/dL (ref <200)
HDL: 46 mg/dL (ref >=40)
LDL Cholesterol (Calc): 54 mg/dL (calc)
Non-HDL Cholesterol (Calc): 82 mg/dL (calc) (ref <130)
Total CHOL/HDL Ratio: 2.8 (calc) (ref <5.0)
Triglycerides: 215 mg/dL — ABNORMAL HIGH (ref <150)

## 2022-06-17 LAB — HEMOGLOBIN A1C
Hgb A1c MFr Bld: 5.7 % of total Hgb — ABNORMAL HIGH (ref <5.7)
Mean Plasma Glucose: 117 mg/dL
eAG (mmol/L): 6.5 mmol/L

## 2022-06-20 ENCOUNTER — Other Ambulatory Visit: Payer: Self-pay | Admitting: Cardiovascular Disease

## 2022-06-24 ENCOUNTER — Encounter: Payer: 59 | Admitting: Internal Medicine

## 2022-07-01 ENCOUNTER — Other Ambulatory Visit: Payer: Self-pay | Admitting: Cardiovascular Disease

## 2022-08-04 ENCOUNTER — Encounter: Payer: 59 | Admitting: Nurse Practitioner

## 2022-08-18 NOTE — Progress Notes (Unsigned)
Complete Physical  Assessment and Plan:  Barry Miles was seen today for annual exam.  Diagnoses and all orders for this visit:  Encounter for general adult medical examination with abnormal findings  Due Annually  EKG  Essential hypertension -     CBC with Differential/Platelet       - - continue medications, DASH diet, exercise. Advised pt I would like himi to start taking his BP at home and call if greater than 130/80.    Gastroesophageal reflux disease with esophagitis without hemorrhage       - Continue aciphex and dietary modifications  Tachycardia   Symptoms controlled, continue to monitor  Seizure (HCC)  No episodes, continue to monitor  Mixed hyperlipidemia -     COMPLETE METABOLIC PANEL WITH GFR -     Lipid panel  Continue, medication and stressed the importance of diet and exercise  Stage 3 a CKD(HCC) Increase fluids, avoid NSAIDS, monitor sugars, will monitor   Carpal tunnel syndrome on both sides  Symptoms are worsening, will make appt with Dr. Amedeo Plenty  Class 2 Severe Obesity due to excess calories with serious comorbidity in adult  Long discussion on diet and exercise.  Stressed importance of fresh fruits and vegetables, increasing fiber, decreasing saturated fats and processed foods.  Medication management -     CBC with Differential/Platelet -     COMPLETE METABOLIC PANEL WITH GFR -     Lipid panel -     TSH -     Hemoglobin A1c -     VITAMIN D 25 Hydroxy (Vit-D Deficiency, Fractures) -     Magnesium -     EKG 12-Lead -     Urinalysis, Routine w reflex microscopic -     Microalbumin / creatinine urine ratio -     PSA  Abnormal Glucose Continue diet, exercise and weight loss -     Hemoglobin A1c  Screening for blood or protein in urine -     Urinalysis, Routine w reflex microscopic -     Microalbumin / creatinine urine ratio  Screening for deficiency anemia  CBC  Screening PSA (prostate specific antigen) -     PSA  Screening, ischemic heart  disease -     EKG 12-Lead  Screening for thyroid disorder -     TSH  Screening for AAA (abdominal aortic aneurysm) -     [place]      Over 40 minutes of face to face interview, exam, counseling, chart review and critical decision making was performed Discussed med's effects and SE's. Screening labs and tests as requested with regular follow-up as recommended.  HPI Patient presents for a complete physical.   He has a history of GERD and taking Aciphex and is controlled.  Pt has a hiatal hernia.   Toenail fungus still present, lamisil had pt break out in hives. Has been trying over the counter topicals.  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  He denies chest pain, shortness of breath, dizziness.   BP Readings from Last 3 Encounters:  06/16/22 114/78  02/19/22 (!) 138/98  10/20/21 127/86    Pt carpal tunnel are becoming more numbness and decreased strength, right > left.  Has been followed Dr. Amedeo Plenty.  He is on cholesterol medication and denies myalgias. His cholesterol is at  goal. The cholesterol last visit was:   Lab Results  Component Value Date   CHOL 128 06/16/2022   HDL 46 06/16/2022   LDLCALC 54 06/16/2022   TRIG 215 (H) 06/16/2022   CHOLHDL 2.8 06/16/2022    He has been working on diet and exercise. Last A1C in the office was:  Lab Results  Component Value Date   HGBA1C 5.7 (H) 06/16/2022    Patient is on Vitamin D supplement.   Last vitamin D Lab Results  Component Value Date   VD25OH 97 08/04/2021    Denies BPH symptoms, does need to use Cialis for erectile issues. Last PSA was: Lab Results  Component Value Date   PSA 1.12 08/04/2021   PSA 0.7 06/20/2020   PSA 1.0 05/25/2019     BMI is There is no height or weight on file to calculate BMI., he is working on diet and exercise. Diet has not  been controlled, eating more processed foods.  Limited exercise Wt Readings from Last 3 Encounters:  06/16/22 210 lb 12.8 oz (95.6 kg)  02/19/22 222 lb (100.7 kg)  10/20/21 226 lb (102.5 kg)    Current Medications:  Current Outpatient Medications on File Prior to Visit  Medication Sig Dispense Refill   Cholecalciferol (VITAMIN D3) 2000 UNITS TABS Take 3 capsules by mouth daily.      hydrochlorothiazide (HYDRODIURIL) 25 MG tablet TAKE 1 TABLET (25 MG TOTAL) BY MOUTH DAILY. 30 tablet 3   ibuprofen (ADVIL) 200 MG tablet Take 200 mg by mouth every 6 (six) hours as needed.     Magnesium 250 MG TABS Take 0.8 tablets (200 mg total) by mouth daily. 30 tablet 1   meclizine (ANTIVERT) 25 MG tablet 1/2-1 pill up to 3 times daily for motion sickness/dizziness 30 tablet 0   Multiple Vitamins-Minerals (MULTIVITAMIN WITH MINERALS) tablet Take 1 tablet by mouth daily.     pantoprazole (PROTONIX) 40 MG tablet Take  1 tablet  Daily  to Prevent Indigestion & Heartburn                                           /                                Take                     by                        mouth 90 tablet 3   rosuvastatin (CRESTOR) 10 MG tablet TAKE 1 TABLET BY MOUTH EVERY DAY. SCHEDULE AN APPOINTMENT FOR FURTHER REFILLS, 3RD ATTEMPT 15 tablet 0   tadalafil (CIALIS) 20 MG tablet Take 1 tablet (20 mg total) by mouth daily as needed for erectile dysfunction. 10 tablet 2   vitamin C (ASCORBIC ACID) 500 MG tablet Take 500 mg by mouth daily.     No current facility-administered medications on file prior to visit.    Health Maintenance:  Immunization History  Administered Date(s) Administered   Influenza Inj Mdck Quad Pf 10/04/2019   Influenza-Unspecified 09/06/2013, 09/27/2018, 10/04/2019   PFIZER(Purple Top)SARS-COV-2 Vaccination 12/09/2020   PPD Test 03/02/2014   Td 08/27/2005   Tdap 02/01/2013   Tetanus: 2014 Flu vaccine: 2021- gets at work Colonoscopy:  2019, Dr. Fuller Plan, 3-5years Eye Exam:  Dr. Peter Garter  , Glasses, goes yearly, due for 11/2021 Dentist: Dr. Reola Calkins    Pneumovax: age 36 Shingrix: pt to call insurance to see if they cover  Patient Care Team: Unk Pinto, MD as PCP - General (Internal Medicine) Rutherford Guys, MD as Consulting Physician (Ophthalmology) Fay Records, MD as Consulting Physician (Cardiology) Ladene Artist, MD as Consulting Physician (Gastroenterology) Franchot Gallo, MD as Consulting Physician (Urology) Roseanne Kaufman, MD as Consulting Physician (Orthopedic Surgery)  Medical History:  Past Medical History:  Diagnosis Date   Arthritis    Arthritis    Carpal tunnel syndrome on both sides    Erectile dysfunction    GERD (gastroesophageal reflux disease)    Seizure ([place]) 08/2019   Vertigo    Allergies Allergies  Allergen Reactions   Terbinafine And Related Rash    SURGICAL HISTORY He  has a past surgical history that includes Tonsillectomy; Colonoscopy (10/03/2018); Polypectomy; and Wisdom tooth extraction. FAMILY HISTORY His family history includes Breast cancer in his mother; Colon polyps in his maternal aunt and maternal uncle; Diabetes in his father, maternal grandmother, mother, and paternal grandmother; Heart disease in his father. SOCIAL HISTORY He  reports that he has never smoked. He has never used smokeless tobacco. He reports that he does not drink alcohol and does not use drugs.   Review of Systems:  Review of Systems  Constitutional:  Negative for chills, fever and malaise/fatigue.  HENT:  Positive for hearing loss (high frequency) and tinnitus. Negative for congestion, ear pain, sinus pain and sore throat.   Eyes: Negative.  Negative for blurred vision and double vision.       Floater in right eye  Respiratory:  Negative for cough, hemoptysis, sputum production, shortness of breath and wheezing.   Cardiovascular:  Negative for chest pain, palpitations and leg swelling.  Gastrointestinal:  Positive for heartburn. Negative for  abdominal pain, blood in stool, constipation, diarrhea, melena, nausea and vomiting.  Genitourinary: Negative.  Negative for dysuria and urgency.  Musculoskeletal:  Positive for back pain and joint pain. Negative for falls, myalgias and neck pain.  Skin:  Negative for rash.  Neurological:  Positive for tingling (right hand) and weakness (left hand). Negative for dizziness, tremors, sensory change, loss of consciousness and headaches.  Endo/Heme/Allergies:  Does not bruise/bleed easily.  Psychiatric/Behavioral:  Negative for depression and suicidal ideas. The patient is not nervous/anxious and does not have insomnia.     Physical Exam: Estimated body mass index is 32.05 kg/m as calculated from the following:   Height as of 10/20/21: '5\' 8"'$  (1.727 m).   Weight as of 06/16/22: 210 lb 12.8 oz (95.6 kg). There were no vitals taken for this visit.  General Appearance: Well nourished, in no apparent distress.  Eyes: PERRLA, EOMs, conjunctiva no swelling or erythema ENT/Mouth: Ear canals some soft wax noted with no erythema, swelling, discharge.  TMs normal bilaterally with no erythema, bulging, or retractions.  Oropharynx clear and moist with no exudate, swelling, or erythema.  Dentition normal.   Neck: Supple, thyroid normal. No bruits, JVD, cervical adenopathy Respiratory: Respiratory effort normal, BS equal bilaterally without rales, rhonchi, wheezing or stridor.  Cardio: RRR without murmurs, rubs or gallops. Brisk peripheral pulses without edema.  Chest: symmetric, with normal excursions Abdomen: Soft, nontender, no guarding, rebound, hernias, masses, or organomegaly. Genitourinary: Patient preference to defer to Dr. Melford Aase or Dr. Payton Mccallum for prostate exam Musculoskeletal: Full ROM all peripheral extremities,5/5 strength, and normal  gait.  Skin: Warm, dry without rashes, lesions, ecchymosis. 4-5 scattered sebborrheic keratoses located on the back Neuro: A&Ox3, Cranial nerves intact, reflexes  equal bilaterally. Normal muscle tone, no cerebellar symptoms. Sensation intact.  Psych: Normal affect, Insight and Judgment appropriate.   EKG: Sinus rhythm w/ no ST changes AAA: <3 cm  Alira Fretwell Mikki Santee, NP 09:45 AM [facility]

## 2022-08-19 ENCOUNTER — Encounter: Payer: Self-pay | Admitting: Nurse Practitioner

## 2022-08-19 ENCOUNTER — Ambulatory Visit (INDEPENDENT_AMBULATORY_CARE_PROVIDER_SITE_OTHER): Payer: 59 | Admitting: Nurse Practitioner

## 2022-08-19 VITALS — BP 110/82 | HR 69 | Temp 97.7°F | Ht 68.0 in | Wt 206.2 lb

## 2022-08-19 DIAGNOSIS — Z79899 Other long term (current) drug therapy: Secondary | ICD-10-CM

## 2022-08-19 DIAGNOSIS — Z125 Encounter for screening for malignant neoplasm of prostate: Secondary | ICD-10-CM | POA: Diagnosis not present

## 2022-08-19 DIAGNOSIS — N401 Enlarged prostate with lower urinary tract symptoms: Secondary | ICD-10-CM

## 2022-08-19 DIAGNOSIS — Z136 Encounter for screening for cardiovascular disorders: Secondary | ICD-10-CM

## 2022-08-19 DIAGNOSIS — I700 Atherosclerosis of aorta: Secondary | ICD-10-CM | POA: Diagnosis not present

## 2022-08-19 DIAGNOSIS — R569 Unspecified convulsions: Secondary | ICD-10-CM

## 2022-08-19 DIAGNOSIS — Z1329 Encounter for screening for other suspected endocrine disorder: Secondary | ICD-10-CM

## 2022-08-19 DIAGNOSIS — N529 Male erectile dysfunction, unspecified: Secondary | ICD-10-CM

## 2022-08-19 DIAGNOSIS — Z0001 Encounter for general adult medical examination with abnormal findings: Secondary | ICD-10-CM

## 2022-08-19 DIAGNOSIS — Z1389 Encounter for screening for other disorder: Secondary | ICD-10-CM

## 2022-08-19 DIAGNOSIS — I10 Essential (primary) hypertension: Secondary | ICD-10-CM

## 2022-08-19 DIAGNOSIS — N1831 Chronic kidney disease, stage 3a: Secondary | ICD-10-CM

## 2022-08-19 DIAGNOSIS — E782 Mixed hyperlipidemia: Secondary | ICD-10-CM

## 2022-08-19 DIAGNOSIS — Z13220 Encounter for screening for lipoid disorders: Secondary | ICD-10-CM

## 2022-08-19 DIAGNOSIS — K2100 Gastro-esophageal reflux disease with esophagitis, without bleeding: Secondary | ICD-10-CM

## 2022-08-19 DIAGNOSIS — L309 Dermatitis, unspecified: Secondary | ICD-10-CM

## 2022-08-19 DIAGNOSIS — Z131 Encounter for screening for diabetes mellitus: Secondary | ICD-10-CM

## 2022-08-19 DIAGNOSIS — E559 Vitamin D deficiency, unspecified: Secondary | ICD-10-CM

## 2022-08-19 DIAGNOSIS — R7309 Other abnormal glucose: Secondary | ICD-10-CM

## 2022-08-19 DIAGNOSIS — Z0000 Encounter for general adult medical examination without abnormal findings: Secondary | ICD-10-CM | POA: Diagnosis not present

## 2022-08-19 DIAGNOSIS — R350 Frequency of micturition: Secondary | ICD-10-CM | POA: Diagnosis not present

## 2022-08-19 DIAGNOSIS — G5603 Carpal tunnel syndrome, bilateral upper limbs: Secondary | ICD-10-CM

## 2022-08-19 MED ORDER — TADALAFIL 20 MG PO TABS: 20.0000 mg | ORAL_TABLET | Freq: Every day | ORAL | 2 refills | Status: AC | PRN

## 2022-08-19 MED ORDER — TRIAMCINOLONE ACETONIDE 0.05 % EX OINT: TOPICAL_OINTMENT | CUTANEOUS | 1 refills | Status: AC

## 2022-08-19 MED ORDER — ROSUVASTATIN CALCIUM 10 MG PO TABS: ORAL_TABLET | ORAL | 6 refills | Status: DC

## 2022-08-20 LAB — CBC WITH DIFFERENTIAL/PLATELET
Absolute Monocytes: 318 cells/uL (ref 200–950)
Basophils Absolute: 21 cells/uL (ref 0–200)
Basophils Relative: 0.4 %
Eosinophils Absolute: 90 cells/uL (ref 15–500)
Eosinophils Relative: 1.7 %
HCT: 46.3 % (ref 38.5–50.0)
Hemoglobin: 15.8 g/dL (ref 13.2–17.1)
Lymphs Abs: 1288 cells/uL (ref 850–3900)
MCH: 30.9 pg (ref 27.0–33.0)
MCHC: 34.1 g/dL (ref 32.0–36.0)
MCV: 90.6 fL (ref 80.0–100.0)
MPV: 9.9 fL (ref 7.5–12.5)
Monocytes Relative: 6 %
Neutro Abs: 3583 cells/uL (ref 1500–7800)
Neutrophils Relative %: 67.6 %
Platelets: 144 10*3/uL (ref 140–400)
RBC: 5.11 10*6/uL (ref 4.20–5.80)
RDW: 13.6 % (ref 11.0–15.0)
Total Lymphocyte: 24.3 %
WBC: 5.3 10*3/uL (ref 3.8–10.8)

## 2022-08-20 LAB — VITAMIN D 25 HYDROXY (VIT D DEFICIENCY, FRACTURES): Vit D, 25-Hydroxy: 83 ng/mL (ref 30–100)

## 2022-08-20 LAB — COMPLETE METABOLIC PANEL WITH GFR
AG Ratio: 2 (calc) (ref 1.0–2.5)
ALT: 16 U/L (ref 9–46)
AST: 18 U/L (ref 10–35)
Albumin: 4.4 g/dL (ref 3.6–5.1)
Alkaline phosphatase (APISO): 66 U/L (ref 35–144)
BUN: 16 mg/dL (ref 7–25)
CO2: 25 mmol/L (ref 20–32)
Calcium: 9.4 mg/dL (ref 8.6–10.3)
Chloride: 105 mmol/L (ref 98–110)
Creat: 1.26 mg/dL (ref 0.70–1.35)
Globulin: 2.2 g/dL (calc) (ref 1.9–3.7)
Glucose, Bld: 82 mg/dL (ref 65–99)
Potassium: 3.9 mmol/L (ref 3.5–5.3)
Sodium: 141 mmol/L (ref 135–146)
Total Bilirubin: 0.6 mg/dL (ref 0.2–1.2)
Total Protein: 6.6 g/dL (ref 6.1–8.1)
eGFR: 64 mL/min/{1.73_m2} (ref >=60)

## 2022-08-20 LAB — URINALYSIS, ROUTINE W REFLEX MICROSCOPIC
Bilirubin Urine: NEGATIVE
Glucose, UA: NEGATIVE
Hgb urine dipstick: NEGATIVE
Ketones, ur: NEGATIVE
Leukocytes,Ua: NEGATIVE
Nitrite: NEGATIVE
Protein, ur: NEGATIVE
Specific Gravity, Urine: 1.011 (ref 1.001–1.035)
pH: 8 (ref 5.0–8.0)

## 2022-08-20 LAB — MICROALBUMIN / CREATININE URINE RATIO
Creatinine, Urine: 62 mg/dL (ref 20–320)
Microalb, Ur: <0.2 mg/dL

## 2022-08-20 LAB — LIPID PANEL
Cholesterol: 132 mg/dL (ref <200)
HDL: 50 mg/dL (ref >=40)
LDL Cholesterol (Calc): 60 mg/dL (calc)
Non-HDL Cholesterol (Calc): 82 mg/dL (calc) (ref <130)
Total CHOL/HDL Ratio: 2.6 (calc) (ref <5.0)
Triglycerides: 137 mg/dL (ref <150)

## 2022-08-20 LAB — PSA: PSA: 0.83 ng/mL (ref <=4.00)

## 2022-08-20 LAB — HEMOGLOBIN A1C
Hgb A1c MFr Bld: 5.4 % of total Hgb (ref <5.7)
Mean Plasma Glucose: 108 mg/dL
eAG (mmol/L): 6 mmol/L

## 2022-08-20 LAB — MAGNESIUM: Magnesium: 2.1 mg/dL (ref 1.5–2.5)

## 2022-08-20 LAB — TSH: TSH: 1.76 mIU/L (ref 0.40–4.50)

## 2022-09-07 NOTE — Progress Notes (Signed)
Assessment and Plan:  Barry Miles was seen today for acute visit.  Diagnoses and all orders for this visit:  Spasm of thoracic back muscle Offered trigger point injection versus steroid course and pt opted for oral steroids Continue stretching exercises, heat and start prednisone taper.  Use flexeril at bedtime only If no improvement in symptoms in the next 7-10 days notify the office -     predniSONE (DELTASONE) 20 MG tablet; 3 tablets daily with food for 3 days, 2 tabs daily for 3 days, 1 tab a day for 5 days. -     cyclobenzaprine (FLEXERIL) 5 MG tablet; Take 1 tablet (5 mg total) by mouth 3 (three) times daily as needed for muscle spasms.  Essential hypertension  - continue medications, DASH diet, exercise and monitor at home. Call if greater than 130/80.      Further disposition pending results of labs. Discussed med's effects and SE's.   Over 30 minutes of exam, counseling, chart review, and critical decision making was performed.   Future Appointments  Date Time Provider [place]  08/20/2023 10:00 AM Alycia Rossetti, NP [facility] None    ------------------------------------------------------------------------------------------------------------------   HPI BP 108/78   Pulse 80   Temp 97.7 F (36.5 C)   Ht '5\' 8"'$  (1.727 m)   Wt 207 lb 9.6 oz (94.2 kg)   SpO2 96%   BMI 31.57 kg/m    Currently controlled on HCTZ 25 mg QD.  Denies headaches, chest pain , shortness of breath and headaches.  BP Readings from Last 3 Encounters:  09/08/22 108/78  08/19/22 110/82  06/16/22 114/78    66 y.o.male presents for mid back pain which occurred 2 weeks ago when he was sitting back in his computer chair. He has a spasm feeling in his left mid back.  Has been using heating pad and salon pas. Symptoms have improved slightly  Past Medical History:  Diagnosis Date   Arthritis    Arthritis    Carpal tunnel syndrome on both sides    Erectile dysfunction    GERD  (gastroesophageal reflux disease)    Seizure ([place]) 08/2019   Vertigo      Allergies  Allergen Reactions   Terbinafine And Related Rash    Current Outpatient Medications on File Prior to Visit  Medication Sig   Cholecalciferol (VITAMIN D3) 2000 UNITS TABS Take 3 capsules by mouth daily.    hydrochlorothiazide (HYDRODIURIL) 25 MG tablet TAKE 1 TABLET (25 MG TOTAL) BY MOUTH DAILY.   ibuprofen (ADVIL) 200 MG tablet Take 200 mg by mouth every 6 (six) hours as needed.   Magnesium 250 MG TABS Take 0.8 tablets (200 mg total) by mouth daily.   meclizine (ANTIVERT) 25 MG tablet 1/2-1 pill up to 3 times daily for motion sickness/dizziness   Multiple Vitamins-Minerals (MULTIVITAMIN WITH MINERALS) tablet Take 1 tablet by mouth daily.   pantoprazole (PROTONIX) 40 MG tablet Take  1 tablet  Daily  to Prevent Indigestion & Heartburn                                           /                                Take  by                        mouth   rosuvastatin (CRESTOR) 10 MG tablet TAKE 1 TABLET BY MOUTH EVERY DAY. SCHEDULE AN APPOINTMENT FOR FURTHER REFILLS, 3RD ATTEMPT   tadalafil (CIALIS) 20 MG tablet Take 1 tablet (20 mg total) by mouth daily as needed for erectile dysfunction.   vitamin C (ASCORBIC ACID) 500 MG tablet Take 500 mg by mouth daily.   TRIAMCINOLONE ACETONIDE, TOP, (TRIANEX) 0.05 % OINT Apply topically daily as needed   No current facility-administered medications on file prior to visit.    ROS: all negative except above.   Physical Exam:  BP 108/78   Pulse 80   Temp 97.7 F (36.5 C)   Ht '5\' 8"'$  (1.727 m)   Wt 207 lb 9.6 oz (94.2 kg)   SpO2 96%   BMI 31.57 kg/m   General Appearance: Well nourished, in no apparent distress. Eyes: PERRLA, EOMs, conjunctiva no swelling or erythema Sinuses: No Frontal/maxillary tenderness ENT/Mouth: Ext aud canals clear, TMs without erythema, bulging. No erythema, swelling, or exudate on post pharynx.  Tonsils not swollen or  erythematous. Hearing normal.  Neck: Supple, thyroid normal.  Respiratory: Respiratory effort normal, BS equal bilaterally without rales, rhonchi, wheezing or stridor.  Cardio: RRR with no MRGs. Brisk peripheral pulses without edema.  Abdomen: Soft, + BS.  Non tender, no guarding, rebound, hernias, masses. Lymphatics: Non tender without lymphadenopathy.  Musculoskeletal: Full ROM, 5/5 strength, normal gait. Tenderness of mid thoracic on right side- paraspinal muscle spasm Skin: Warm, dry without rashes, lesions, ecchymosis.  Neuro: Cranial nerves intact. Normal muscle tone, no cerebellar symptoms. Sensation intact.  Psych: Awake and oriented X 3, normal affect, Insight and Judgment appropriate.     Alycia Rossetti, NP 12:04 PM [facility]

## 2022-09-08 ENCOUNTER — Ambulatory Visit (INDEPENDENT_AMBULATORY_CARE_PROVIDER_SITE_OTHER): Payer: 59 | Admitting: Nurse Practitioner

## 2022-09-08 ENCOUNTER — Encounter: Payer: Self-pay | Admitting: Nurse Practitioner

## 2022-09-08 VITALS — BP 108/78 | HR 80 | Temp 97.7°F | Ht 68.0 in | Wt 207.6 lb

## 2022-09-08 DIAGNOSIS — I10 Essential (primary) hypertension: Secondary | ICD-10-CM

## 2022-09-08 DIAGNOSIS — M62830 Muscle spasm of back: Secondary | ICD-10-CM

## 2022-09-08 MED ORDER — CYCLOBENZAPRINE HCL 5 MG PO TABS: 5.0000 mg | ORAL_TABLET | Freq: Three times a day (TID) | ORAL | 0 refills | Status: AC | PRN

## 2022-09-08 MED ORDER — PREDNISONE 20 MG PO TABS: ORAL_TABLET | ORAL | 0 refills | Status: AC

## 2022-09-08 NOTE — Patient Instructions (Signed)
Prednisone taper as directed Flexeril(cyclobenzaprine) at bedtime If no relief or symptoms worsen notify the office  Muscle Cramps and Spasms Muscle cramps and spasms occur when a muscle or muscles tighten and you have no control over this tightening (involuntary muscle contraction). They are a common problem and can develop in any muscle. The most common place is in the calf muscles of the leg. Muscle cramps and muscle spasms are both involuntary muscle contractions, but there are some differences between the two: Muscle cramps are painful. They come and go and may last for a few seconds or up to 15 minutes. Muscle cramps are often more forceful and last longer than muscle spasms. Muscle spasms may or may not be painful. They may also last just a few seconds or much longer. Certain medical conditions, such as diabetes or Parkinson's disease, can make it more likely to develop cramps or spasms. However, cramps or spasms are usually not caused by a serious underlying problem. Common causes include: Doing more physical work or exercise than your body is ready for (overexertion). Overuse from repeating certain movements too many times. Remaining in a certain position for a long period of time. Improper preparation, form, or technique while playing a sport or doing an activity. Dehydration. Injury. Side effects of some medicines. Abnormally low levels of the salts and minerals in your blood (electrolytes), especially potassium and calcium. This could happen if you are taking water pills (diuretics) or if you are pregnant. In many cases, the cause of muscle cramps or spasms is not known. Follow these instructions at home: Managing pain and stiffness     Try massaging, stretching, and relaxing the affected muscle. Do this for several minutes at a time. If directed, apply heat to tight or tense muscles as often as told by your health care provider. Use the heat source that your health care provider  recommends, such as a moist heat pack or a heating pad. Place a towel between your skin and the heat source. Leave the heat on for 20-30 minutes. Remove the heat if your skin turns bright red. This is especially important if you are unable to feel pain, heat, or cold. You may have a greater risk of getting burned. If directed, put ice on the affected area. This may help if you are sore or have pain after a cramp or spasm. Put ice in a plastic bag. Place a towel between your skin and the bag. Leave the ice on for 20 minutes, 2-3 times a day. Try taking hot showers or baths to help relax tight muscles. Eating and drinking Drink enough fluid to keep your urine pale yellow. Staying well hydrated may help prevent cramps or spasms. Eat a healthy diet that includes plenty of nutrients to help your muscles function. A healthy diet includes fruits and vegetables, lean protein, whole grains, and low-fat or nonfat dairy products. General instructions If you are having frequent cramps, avoid intense exercise for several days. Take over-the-counter and prescription medicines only as told by your health care provider. Pay attention to any changes in your symptoms. Keep all follow-up visits as told by your health care provider. This is important. Contact a health care provider if: Your cramps or spasms get more severe or happen more often. Your cramps or spasms do not improve over time. Summary Muscle cramps and spasms occur when a muscle or muscles tighten and you have no control over this tightening (involuntary muscle contraction). The most common place for cramps or  spasms to occur is in the calf muscles of the leg. Massaging, stretching, and relaxing the affected muscle may relieve the cramp or spasm. Drink enough fluid to keep your urine pale yellow. Staying well hydrated may help prevent cramps or spasms. This information is not intended to replace advice given to you by your health care provider.  Make sure you discuss any questions you have with your health care provider. Document Revised: 06/13/2021 Document Reviewed: 06/13/2021 Elsevier Patient Education  [place].

## 2022-09-10 ENCOUNTER — Encounter: Payer: 59 | Admitting: Nurse Practitioner

## 2022-10-19 ENCOUNTER — Telehealth: Payer: Self-pay | Admitting: Nurse Practitioner

## 2022-10-19 NOTE — Telephone Encounter (Signed)
Patient is requesting a refill on HCTZ to [facility] on [address].

## 2022-10-20 ENCOUNTER — Other Ambulatory Visit: Payer: Self-pay | Admitting: Nurse Practitioner

## 2022-10-20 DIAGNOSIS — I10 Essential (primary) hypertension: Secondary | ICD-10-CM

## 2022-10-20 MED ORDER — HYDROCHLOROTHIAZIDE 25 MG PO TABS: 25.0000 mg | ORAL_TABLET | Freq: Every day | ORAL | 3 refills | Status: DC

## 2023-02-09 ENCOUNTER — Other Ambulatory Visit: Payer: Self-pay | Admitting: Nurse Practitioner

## 2023-02-09 DIAGNOSIS — I10 Essential (primary) hypertension: Secondary | ICD-10-CM

## 2023-03-24 ENCOUNTER — Other Ambulatory Visit: Payer: Self-pay | Admitting: Nurse Practitioner

## 2023-03-24 DIAGNOSIS — E782 Mixed hyperlipidemia: Secondary | ICD-10-CM

## 2023-06-14 ENCOUNTER — Other Ambulatory Visit: Payer: Self-pay | Admitting: Internal Medicine

## 2023-06-14 ENCOUNTER — Other Ambulatory Visit: Payer: Self-pay | Admitting: Nurse Practitioner

## 2023-06-14 DIAGNOSIS — I10 Essential (primary) hypertension: Secondary | ICD-10-CM

## 2023-06-14 DIAGNOSIS — K2100 Gastro-esophageal reflux disease with esophagitis, without bleeding: Secondary | ICD-10-CM

## 2023-08-20 ENCOUNTER — Encounter: Payer: 59 | Admitting: Nurse Practitioner

## 2023-08-20 ENCOUNTER — Ambulatory Visit (INDEPENDENT_AMBULATORY_CARE_PROVIDER_SITE_OTHER): Payer: 59 | Admitting: Nurse Practitioner

## 2023-08-20 ENCOUNTER — Encounter: Payer: Self-pay | Admitting: Nurse Practitioner

## 2023-08-20 VITALS — BP 150/100 | HR 74 | Temp 98.4°F | Ht 68.0 in | Wt 214.4 lb

## 2023-08-20 DIAGNOSIS — Z0000 Encounter for general adult medical examination without abnormal findings: Secondary | ICD-10-CM | POA: Diagnosis not present

## 2023-08-20 DIAGNOSIS — E559 Vitamin D deficiency, unspecified: Secondary | ICD-10-CM | POA: Diagnosis not present

## 2023-08-20 DIAGNOSIS — N401 Enlarged prostate with lower urinary tract symptoms: Secondary | ICD-10-CM

## 2023-08-20 DIAGNOSIS — R350 Frequency of micturition: Secondary | ICD-10-CM

## 2023-08-20 DIAGNOSIS — L309 Dermatitis, unspecified: Secondary | ICD-10-CM

## 2023-08-20 DIAGNOSIS — Z131 Encounter for screening for diabetes mellitus: Secondary | ICD-10-CM

## 2023-08-20 DIAGNOSIS — Z79899 Other long term (current) drug therapy: Secondary | ICD-10-CM

## 2023-08-20 DIAGNOSIS — Z0001 Encounter for general adult medical examination with abnormal findings: Secondary | ICD-10-CM

## 2023-08-20 DIAGNOSIS — Z1389 Encounter for screening for other disorder: Secondary | ICD-10-CM

## 2023-08-20 DIAGNOSIS — I10 Essential (primary) hypertension: Secondary | ICD-10-CM | POA: Diagnosis not present

## 2023-08-20 DIAGNOSIS — R7309 Other abnormal glucose: Secondary | ICD-10-CM

## 2023-08-20 DIAGNOSIS — Z136 Encounter for screening for cardiovascular disorders: Secondary | ICD-10-CM

## 2023-08-20 DIAGNOSIS — E782 Mixed hyperlipidemia: Secondary | ICD-10-CM

## 2023-08-20 DIAGNOSIS — R569 Unspecified convulsions: Secondary | ICD-10-CM

## 2023-08-20 DIAGNOSIS — K2100 Gastro-esophageal reflux disease with esophagitis, without bleeding: Secondary | ICD-10-CM

## 2023-08-20 DIAGNOSIS — N1831 Chronic kidney disease, stage 3a: Secondary | ICD-10-CM

## 2023-08-20 DIAGNOSIS — Z13220 Encounter for screening for lipoid disorders: Secondary | ICD-10-CM

## 2023-08-20 DIAGNOSIS — Z125 Encounter for screening for malignant neoplasm of prostate: Secondary | ICD-10-CM

## 2023-08-20 DIAGNOSIS — G5603 Carpal tunnel syndrome, bilateral upper limbs: Secondary | ICD-10-CM

## 2023-08-20 NOTE — Progress Notes (Signed)
Complete Physical  Assessment and Plan:  Barry Miles was seen today for annual exam.  Diagnoses and all orders for this visit:  Encounter for general adult medical examination with abnormal findings Due annually  Health maintenance reviewed Healthily lifestyle goals set  Essential hypertension Discussed DASH (Dietary Approaches to Stop Hypertension) DASH diet is lower in sodium than a typical American diet. Cut back on foods that are high in saturated fat, cholesterol, and trans fats. Eat more whole-grain foods, fish, poultry, and nuts Remain active and exercise as tolerated daily.  Monitor BP at home-Call if greater than 130/80.  Check CMP/CBC  Gastroesophageal reflux disease with esophagitis without hemorrhage No suspected reflux complications (Barret/stricture). Lifestyle modification:  wt loss, avoid meals 2-3h before bedtime. Consider eliminating food triggers:  chocolate, caffeine, EtOH, acid/spicy food.   Seizure (HCC) Well controlled   Mixed hyperlipidemia Discussed lifestyle modifications. Recommended diet heavy in fruits and veggies, omega 3's. Decrease consumption of animal meats, cheeses, and dairy products. Remain active and exercise as tolerated. Continue to monitor. Check lipids/TSH  Stage 3 a CKD(HCC) Discussed how what you eat and drink can aide in kidney protection. Stay well hydrated. Avoid high salt foods. Avoid NSAIDS. Keep BP and BG well controlled.   Take medications as prescribed. Remain active and exercise as tolerated daily. Maintain weight.  Continue to monitor. Check CMP/GFR/Microablumin  Carpal tunnel syndrome on both sides Dr. Amanda Pea following  Class 2 Severe Obesity due to excess calories with serious comorbidity in adult Discussed appropriate BMI Diet modification. Physical activity. Encouraged/praised to build confidence.  Medication management All medications discussed and reviewed in full. All questions and concerns  regarding medications addressed.    Vitamin D deficiency Continue supplement for goal of 60-100 Monitor Vitamin D levels   Abnormal Glucose Education: Reviewed 'ABCs' of diabetes management  Discussed goals to be met and/or maintained include A1C (<7) Blood pressure (<130/80) Cholesterol (LDL <70) Continue Eye Exam yearly  Continue Dental Exam Q6 mo Discussed dietary recommendations Discussed Physical Activity recommendations Check A1C/insulin  Eczema Continue medications PRN Keep skin free of moisure Avoid triggers  Screening for blood or protein in urine -     Urinalysis, Routine w reflex microscopic -     Microalbumin / creatinine urine ratio  Screening PSA (prostate specific antigen) -     PSA  Screening, ischemic heart disease -     EKG 12-Lead  Screening for thyroid disorder -     TSH  Orders Placed This Encounter  Procedures   CBC with Differential/Platelet   COMPLETE METABOLIC PANEL WITH GFR   Magnesium   Lipid panel   TSH   Hemoglobin A1c   Insulin, random   VITAMIN D 25 Hydroxy (Vit-D Deficiency, Fractures)   Urinalysis, Routine w reflex microscopic   Microalbumin / creatinine urine ratio   PSA   EKG 12-Lead    Notify office for further evaluation and treatment, questions or concerns if any reported s/s fail to improve.   The patient was advised to call back or seek an in-person evaluation if any symptoms worsen or if the condition fails to improve as anticipated.   Further disposition pending results of labs. Discussed med's effects and SE's.    I discussed the assessment and treatment plan with the patient. The patient was provided an opportunity to ask questions and all were answered. The patient agreed with the plan and demonstrated an understanding of the instructions.  Discussed med's effects and SE's. Screening labs and tests as requested  with regular follow-up as recommended.  I provided 40 minutes of face-to-face time during this  encounter including counseling, chart review, and critical decision making was preformed.  Today's Plan of Care is based on a patient-centered health care approach known as shared decision making - the decisions, tests and treatments allow for patient preferences and values to be balanced with clinical evidence.      Future Appointments  Date Time Provider Department Center  08/21/2024 10:00 AM Adela Glimpse, NP [facility] None    HPI Patient presents for a complete physical.   Overall he reports feeling well today.  He has a history of GERD and taking Pantoprazole 40 mg  and is controlled.  Pt has a hiatal hernia.   BMI is Body mass index is 32.6 kg/m., he has been working on diet and exercise. He is down 16 pounds in 6 months.  He is eating less simple carbs and decrease portion size.  Wt Readings from Last 3 Encounters:  08/20/23 214 lb 6.4 oz (97.3 kg)  09/08/22 207 lb 9.6 oz (94.2 kg)  08/19/22 206 lb 3.2 oz (93.5 kg)   He has a history of seizure but is on no medication and has had no recent seizure activity. He only had 1 seizure in his life which was in 2020   His blood pressure has been controlled at home, today their BP is BP: (!) 150/100 Did not take medication today.  He does not workout due to physically active job, Works as Psychologist, occupational for [employer], no air conditioning in the summer.  He denies chest pain, shortness of breath, dizziness. BP Readings from Last 3 Encounters:  08/20/23 (!) 150/100  09/08/22 108/78  08/19/22 110/82    Pt carpal tunnel are becoming more numbness and decreased strength, right > left.  Has been followed Dr. Amanda Pea.  He is on cholesterol medication, Rosuvastatin 10 mg every other day,  and denies myalgias. His cholesterol is at goal. The cholesterol last visit was:   Lab Results  Component Value Date   CHOL 132 08/19/2022   HDL 50 08/19/2022   LDLCALC 60 08/19/2022   TRIG 137 08/19/2022   CHOLHDL 2.6 08/19/2022    He has been working  on diet and exercise for abnormal glucose. Last A1C in the office was:  Lab Results  Component Value Date   HGBA1C 5.4 08/19/2022    Patient is on Vitamin D supplement.   Last vitamin D Lab Results  Component Value Date   VD25OH 48 08/19/2022   Does get up once a night to go to the bathroom, does need to use Cialis for erectile issues. Last PSA was: Lab Results  Component Value Date   PSA 0.83 08/19/2022   PSA 1.12 08/04/2021   PSA 0.7 06/20/2020   He does have some eczema on lower legs, worse in the winter,  Uses Trianex and it does help.  Also some possible eczema areas on flanks bilaterally.  Last GFR was in CKD stage 3 for the first time.  He has been trying to drink more fluids, especially water.  Lab Results  Component Value Date   EGFR 64 08/19/2022        Current Medications:  Current Outpatient Medications on File Prior to Visit  Medication Sig Dispense Refill   Cholecalciferol (VITAMIN D3) 2000 UNITS TABS Take 3 capsules by mouth daily.      cyclobenzaprine (FLEXERIL) 5 MG tablet Take 1 tablet (5 mg total) by mouth 3 (three)  times daily as needed for muscle spasms. 60 tablet 0   hydrochlorothiazide (HYDRODIURIL) 25 MG tablet TAKE 1 TABLET (25 MG TOTAL) BY MOUTH DAILY. 30 tablet 3   ibuprofen (ADVIL) 200 MG tablet Take 200 mg by mouth every 6 (six) hours as needed.     Magnesium 250 MG TABS Take 0.8 tablets (200 mg total) by mouth daily. 30 tablet 1   meclizine (ANTIVERT) 25 MG tablet 1/2-1 pill up to 3 times daily for motion sickness/dizziness 30 tablet 0   Multiple Vitamins-Minerals (MULTIVITAMIN WITH MINERALS) tablet Take 1 tablet by mouth daily.     pantoprazole (PROTONIX) 40 MG tablet TAKE 1 TABLET BY MOUTH DAILY TO PREVENT INDIGESTION & HEARTBURN 90 tablet 3   rosuvastatin (CRESTOR) 10 MG tablet TAKE 1 TABLET BY MOUTH EVERY DAY. SCHEDULE AN APPOINTMENT FOR FURTHER REFILLS, 3RD ATTEMPT (Patient taking differently: TAKE 1 TABLET BY MOUTH EVERY DAY.) 30 tablet 6    tadalafil (CIALIS) 20 MG tablet Take 1 tablet (20 mg total) by mouth daily as needed for erectile dysfunction. 10 tablet 2   TRIAMCINOLONE ACETONIDE, TOP, (TRIANEX) 0.05 % OINT Apply topically daily as needed 430 g 1   vitamin C (ASCORBIC ACID) 500 MG tablet Take 500 mg by mouth daily.     No current facility-administered medications on file prior to visit.    Health Maintenance:  Immunization History  Administered Date(s) Administered   Influenza Inj Mdck Quad Pf 10/04/2019   Influenza-Unspecified 09/06/2013, 09/27/2018, 10/04/2019   PFIZER(Purple Top)SARS-COV-2 Vaccination 12/09/2020   PPD Test 03/02/2014   Td 08/27/2005   Tdap 02/01/2013   Tetanus: 2014 Flu vaccine: 2021- gets at work Colonoscopy: 2019, Dr. Russella Dar, 5years Eye Exam:  Dr. Sharlot Gowda , Glasses, goes yearly, 2024 Dentist: Dr. Ellis Parents    Pneumovax: age 81 Shingrix: pt to call insurance to see if they cover  Patient Care Team: Lucky Cowboy, MD as PCP - General (Internal Medicine) Jethro Bolus, MD as Consulting Physician (Ophthalmology) Pricilla Riffle, MD as Consulting Physician (Cardiology) Meryl Dare, MD as Consulting Physician (Gastroenterology) Marcine Matar, MD as Consulting Physician (Urology) Dominica Severin, MD as Consulting Physician (Orthopedic Surgery)  Medical History:  Past Medical History:  Diagnosis Date   Arthritis    Arthritis    Carpal tunnel syndrome on both sides    Erectile dysfunction    GERD (gastroesophageal reflux disease)    Seizure (HCC) 08/2019   Vertigo    Allergies Allergies  Allergen Reactions   Terbinafine And Related Rash    SURGICAL HISTORY He  has a past surgical history that includes Tonsillectomy; Colonoscopy (10/03/2018); Polypectomy; and Wisdom tooth extraction. FAMILY HISTORY His family history includes Breast cancer in his mother; Colon polyps in his maternal aunt and maternal uncle; Diabetes in his father, maternal grandmother, mother, and paternal  grandmother; Heart disease in his father. SOCIAL HISTORY He  reports that he has never smoked. He has never used smokeless tobacco. He reports that he does not drink alcohol and does not use drugs.   Review of Systems:  Review of Systems  Constitutional:  Negative for chills, fever and malaise/fatigue.  HENT:  Positive for hearing loss (high frequency) and tinnitus. Negative for congestion, ear pain, sinus pain and sore throat.   Eyes: Negative.  Negative for blurred vision and double vision.       Floater in right eye  Respiratory:  Negative for cough, hemoptysis, sputum production, shortness of breath and wheezing.   Cardiovascular:  Negative for chest pain,  palpitations and leg swelling.  Gastrointestinal:  Positive for heartburn. Negative for abdominal pain, blood in stool, constipation, diarrhea, melena, nausea and vomiting.  Genitourinary: Negative.  Negative for dysuria and urgency.  Musculoskeletal:  Positive for back pain and joint pain. Negative for falls, myalgias and neck pain.  Skin:  Positive for rash (eczema).  Neurological:  Positive for tingling (right hand) and weakness (left hand). Negative for dizziness, tremors, sensory change, loss of consciousness and headaches.  Endo/Heme/Allergies:  Does not bruise/bleed easily.  Psychiatric/Behavioral:  Negative for depression and suicidal ideas. The patient is not nervous/anxious and does not have insomnia.     Physical Exam: Estimated body mass index is 32.6 kg/m as calculated from the following:   Height as of this encounter: 5\' 8"  (1.727 m).   Weight as of this encounter: 214 lb 6.4 oz (97.3 kg). BP (!) 150/100   Pulse 74   Temp 98.4 F (36.9 C)   Ht 5\' 8"  (1.727 m)   Wt 214 lb 6.4 oz (97.3 kg)   SpO2 99%   BMI 32.60 kg/m   General Appearance: Well nourished, in no apparent distress.  Eyes: PERRLA, EOMs, conjunctiva no swelling or erythema ENT/Mouth: Ear canals some soft wax noted with no erythema, swelling,  discharge.  TMs normal bilaterally with no erythema, bulging, or retractions.  Oropharynx clear and moist with no exudate, swelling, or erythema.  Dentition normal.   Neck: Supple, thyroid normal. No bruits, JVD, cervical adenopathy Respiratory: Respiratory effort normal, BS equal bilaterally without rales, rhonchi, wheezing or stridor.  Cardio: RRR without murmurs, rubs or gallops. Brisk peripheral pulses without edema.  Chest: symmetric, with normal excursions Abdomen: Soft, nontender, no guarding, rebound, hernias, masses, or organomegaly. Genitourinary: Patient preference to defer to Dr. Oneta Rack or Dr. Urban Gibson for prostate exam Musculoskeletal: Full ROM all peripheral extremities,5/5 strength, and normal gait.   Skin: Warm, dry without rashes, lesions, ecchymosis. 4-5 scattered sebborrheic keratoses located on the back. Eczema patches of lower legs. Small papules of flanks bilaterally Neuro: A&Ox3, Cranial nerves intact, reflexes equal bilaterally. Normal muscle tone, no cerebellar symptoms. Sensation intact.  Psych: Normal affect, Insight and Judgment appropriate.   EKG: Sinus rhythm w/ no ST changes   Adela Glimpse, NP 09:45 AM [facility]

## 2023-08-21 LAB — URINALYSIS, ROUTINE W REFLEX MICROSCOPIC
Bilirubin Urine: NEGATIVE
Glucose, UA: NEGATIVE
Hgb urine dipstick: NEGATIVE
Ketones, ur: NEGATIVE
Leukocytes,Ua: NEGATIVE
Nitrite: NEGATIVE
Protein, ur: NEGATIVE
Specific Gravity, Urine: 1.01 (ref 1.001–1.035)
pH: 7.5 (ref 5.0–8.0)

## 2023-08-21 LAB — COMPLETE METABOLIC PANEL WITH GFR
AG Ratio: 1.9 (calc) (ref 1.0–2.5)
ALT: 17 U/L (ref 9–46)
AST: 18 U/L (ref 10–35)
Albumin: 4.4 g/dL (ref 3.6–5.1)
Alkaline phosphatase (APISO): 62 U/L (ref 35–144)
BUN: 19 mg/dL (ref 7–25)
CO2: 28 mmol/L (ref 20–32)
Calcium: 9.4 mg/dL (ref 8.6–10.3)
Chloride: 103 mmol/L (ref 98–110)
Creat: 1.32 mg/dL (ref 0.70–1.35)
Globulin: 2.3 g/dL (ref 1.9–3.7)
Glucose, Bld: 86 mg/dL (ref 65–99)
Potassium: 4 mmol/L (ref 3.5–5.3)
Sodium: 139 mmol/L (ref 135–146)
Total Bilirubin: 0.8 mg/dL (ref 0.2–1.2)
Total Protein: 6.7 g/dL (ref 6.1–8.1)
eGFR: 60 mL/min/{1.73_m2} (ref >=60)

## 2023-08-21 LAB — CBC WITH DIFFERENTIAL/PLATELET
Absolute Monocytes: 301 {cells}/uL (ref 200–950)
Basophils Absolute: 28 {cells}/uL (ref 0–200)
Basophils Relative: 0.6 %
Eosinophils Absolute: 89 {cells}/uL (ref 15–500)
Eosinophils Relative: 1.9 %
HCT: 48.3 % (ref 38.5–50.0)
Hemoglobin: 16.7 g/dL (ref 13.2–17.1)
Lymphs Abs: 987 {cells}/uL (ref 850–3900)
MCH: 31.6 pg (ref 27.0–33.0)
MCHC: 34.6 g/dL (ref 32.0–36.0)
MCV: 91.3 fL (ref 80.0–100.0)
MPV: 9.6 fL (ref 7.5–12.5)
Monocytes Relative: 6.4 %
Neutro Abs: 3295 {cells}/uL (ref 1500–7800)
Neutrophils Relative %: 70.1 %
Platelets: 158 10*3/uL (ref 140–400)
RBC: 5.29 10*6/uL (ref 4.20–5.80)
RDW: 13 % (ref 11.0–15.0)
Total Lymphocyte: 21 %
WBC: 4.7 10*3/uL (ref 3.8–10.8)

## 2023-08-21 LAB — HEMOGLOBIN A1C
Hgb A1c MFr Bld: 5.7 %{Hb} — ABNORMAL HIGH (ref <5.7)
Mean Plasma Glucose: 117 mg/dL
eAG (mmol/L): 6.5 mmol/L

## 2023-08-21 LAB — LIPID PANEL
Cholesterol: 157 mg/dL (ref <200)
HDL: 53 mg/dL (ref >=40)
LDL Cholesterol (Calc): 83 mg/dL
Non-HDL Cholesterol (Calc): 104 mg/dL (ref <130)
Total CHOL/HDL Ratio: 3 (calc) (ref <5.0)
Triglycerides: 115 mg/dL (ref <150)

## 2023-08-21 LAB — VITAMIN D 25 HYDROXY (VIT D DEFICIENCY, FRACTURES): Vit D, 25-Hydroxy: 84 ng/mL (ref 30–100)

## 2023-08-21 LAB — MICROALBUMIN / CREATININE URINE RATIO
Creatinine, Urine: 50 mg/dL (ref 20–320)
Microalb, Ur: <0.2 mg/dL

## 2023-08-21 LAB — PSA: PSA: 1.55 ng/mL (ref <=4.00)

## 2023-08-21 LAB — MAGNESIUM: Magnesium: 2.2 mg/dL (ref 1.5–2.5)

## 2023-08-21 LAB — TSH: TSH: 1.56 m[IU]/L (ref 0.40–4.50)

## 2023-08-21 LAB — INSULIN, RANDOM: Insulin: 7.5 u[IU]/mL

## 2023-08-29 NOTE — Patient Instructions (Signed)

## 2023-10-10 ENCOUNTER — Other Ambulatory Visit: Payer: Self-pay | Admitting: Nurse Practitioner

## 2023-10-10 DIAGNOSIS — I10 Essential (primary) hypertension: Secondary | ICD-10-CM

## 2023-12-22 ENCOUNTER — Telehealth: Payer: Self-pay | Admitting: Nurse Practitioner

## 2023-12-22 ENCOUNTER — Other Ambulatory Visit: Payer: Self-pay

## 2023-12-22 DIAGNOSIS — I10 Essential (primary) hypertension: Secondary | ICD-10-CM

## 2023-12-22 MED ORDER — HYDROCHLOROTHIAZIDE 25 MG PO TABS: 25.0000 mg | ORAL_TABLET | Freq: Every day | ORAL | 3 refills | Status: AC

## 2023-12-22 NOTE — Telephone Encounter (Signed)
 Patient's insurance has changed and requires him to use [facility]. Please send RX for hydrochlorothiazide  to the [facility] on [address]

## 2023-12-22 NOTE — Telephone Encounter (Signed)
 Please refill above 90 days with 3 refills

## 2024-02-17 ENCOUNTER — Ambulatory Visit: Payer: 59 | Admitting: Nurse Practitioner

## 2024-08-21 ENCOUNTER — Encounter: Payer: 59 | Admitting: Nurse Practitioner

## 2024-09-20 ENCOUNTER — Other Ambulatory Visit: Payer: Self-pay | Admitting: Nurse Practitioner

## 2024-09-20 DIAGNOSIS — I10 Essential (primary) hypertension: Secondary | ICD-10-CM
